# Patient Record
Sex: Female | Born: 1985 | Race: White | Hispanic: No | Marital: Married | Smoking: Never smoker
Health system: Southern US, Community
[De-identification: ages and names within clinical notes are randomized; demographics above are authoritative.]

## PROBLEM LIST (undated history)

## (undated) ENCOUNTER — Inpatient Hospital Stay (HOSPITAL_COMMUNITY): Payer: Self-pay

## (undated) DIAGNOSIS — M779 Enthesopathy, unspecified: Secondary | ICD-10-CM

## (undated) DIAGNOSIS — F419 Anxiety disorder, unspecified: Secondary | ICD-10-CM

## (undated) DIAGNOSIS — F329 Major depressive disorder, single episode, unspecified: Secondary | ICD-10-CM

## (undated) DIAGNOSIS — Z8739 Personal history of other diseases of the musculoskeletal system and connective tissue: Secondary | ICD-10-CM

## (undated) DIAGNOSIS — E079 Disorder of thyroid, unspecified: Secondary | ICD-10-CM

## (undated) DIAGNOSIS — J45909 Unspecified asthma, uncomplicated: Secondary | ICD-10-CM

## (undated) DIAGNOSIS — O139 Gestational [pregnancy-induced] hypertension without significant proteinuria, unspecified trimester: Secondary | ICD-10-CM

## (undated) DIAGNOSIS — E039 Hypothyroidism, unspecified: Secondary | ICD-10-CM

## (undated) DIAGNOSIS — J302 Other seasonal allergic rhinitis: Secondary | ICD-10-CM

## (undated) DIAGNOSIS — R51 Headache: Secondary | ICD-10-CM

## (undated) HISTORY — DX: Unspecified asthma, uncomplicated: J45.909

## (undated) HISTORY — PX: TONSILLECTOMY: SUR1361

## (undated) HISTORY — DX: Personal history of other diseases of the musculoskeletal system and connective tissue: Z87.39

## (undated) HISTORY — DX: Hypothyroidism, unspecified: E03.9

## (undated) HISTORY — DX: Anxiety disorder, unspecified: F41.9

## (undated) HISTORY — DX: Disorder of thyroid, unspecified: E07.9

## (undated) HISTORY — DX: Major depressive disorder, single episode, unspecified: F32.9

## (undated) HISTORY — PX: OTHER SURGICAL HISTORY: SHX169

## (undated) HISTORY — DX: Headache: R51

## (undated) HISTORY — DX: Gestational (pregnancy-induced) hypertension without significant proteinuria, unspecified trimester: O13.9

---

## 2007-02-27 DIAGNOSIS — E039 Hypothyroidism, unspecified: Secondary | ICD-10-CM

## 2007-02-27 DIAGNOSIS — F32A Depression, unspecified: Secondary | ICD-10-CM

## 2007-02-27 HISTORY — DX: Depression, unspecified: F32.A

## 2007-02-27 HISTORY — DX: Hypothyroidism, unspecified: E03.9

## 2008-02-27 DIAGNOSIS — F419 Anxiety disorder, unspecified: Secondary | ICD-10-CM

## 2008-02-27 HISTORY — DX: Anxiety disorder, unspecified: F41.9

## 2010-02-26 DIAGNOSIS — O139 Gestational [pregnancy-induced] hypertension without significant proteinuria, unspecified trimester: Secondary | ICD-10-CM

## 2010-02-26 HISTORY — DX: Gestational (pregnancy-induced) hypertension without significant proteinuria, unspecified trimester: O13.9

## 2010-06-23 ENCOUNTER — Other Ambulatory Visit (HOSPITAL_COMMUNITY): Payer: Self-pay | Admitting: Advanced Practice Midwife

## 2010-06-23 DIAGNOSIS — O2690 Pregnancy related conditions, unspecified, unspecified trimester: Secondary | ICD-10-CM

## 2010-06-27 ENCOUNTER — Other Ambulatory Visit (HOSPITAL_COMMUNITY): Payer: Self-pay | Admitting: Advanced Practice Midwife

## 2010-06-27 ENCOUNTER — Ambulatory Visit (HOSPITAL_COMMUNITY)
Admission: RE | Admit: 2010-06-27 | Discharge: 2010-06-27 | Disposition: A | Discharge status: Home / Self Care | Payer: BC Managed Care – PPO | Source: Ambulatory Visit | Attending: Advanced Practice Midwife | Admitting: Advanced Practice Midwife

## 2010-06-27 DIAGNOSIS — O358XX0 Maternal care for other (suspected) fetal abnormality and damage, not applicable or unspecified: Secondary | ICD-10-CM | POA: Insufficient documentation

## 2010-06-27 DIAGNOSIS — N2889 Other specified disorders of kidney and ureter: Secondary | ICD-10-CM

## 2010-06-27 DIAGNOSIS — Z1389 Encounter for screening for other disorder: Secondary | ICD-10-CM | POA: Insufficient documentation

## 2010-06-27 DIAGNOSIS — O2690 Pregnancy related conditions, unspecified, unspecified trimester: Secondary | ICD-10-CM

## 2010-06-27 DIAGNOSIS — Z363 Encounter for antenatal screening for malformations: Secondary | ICD-10-CM | POA: Insufficient documentation

## 2010-07-18 ENCOUNTER — Ambulatory Visit (HOSPITAL_COMMUNITY): Payer: BC Managed Care – PPO

## 2010-07-19 ENCOUNTER — Other Ambulatory Visit (HOSPITAL_COMMUNITY): Payer: Self-pay | Admitting: Maternal and Fetal Medicine

## 2010-07-19 DIAGNOSIS — O288 Other abnormal findings on antenatal screening of mother: Secondary | ICD-10-CM

## 2010-07-25 ENCOUNTER — Ambulatory Visit (HOSPITAL_COMMUNITY)
Admission: RE | Admit: 2010-07-25 | Discharge: 2010-07-25 | Disposition: A | Discharge status: Home / Self Care | Payer: BC Managed Care – PPO | Source: Ambulatory Visit | Attending: Advanced Practice Midwife | Admitting: Advanced Practice Midwife

## 2010-07-25 ENCOUNTER — Other Ambulatory Visit (HOSPITAL_COMMUNITY): Payer: Self-pay | Admitting: Maternal and Fetal Medicine

## 2010-07-25 DIAGNOSIS — O288 Other abnormal findings on antenatal screening of mother: Secondary | ICD-10-CM

## 2010-07-25 DIAGNOSIS — O358XX0 Maternal care for other (suspected) fetal abnormality and damage, not applicable or unspecified: Secondary | ICD-10-CM | POA: Insufficient documentation

## 2010-07-25 DIAGNOSIS — Z3689 Encounter for other specified antenatal screening: Secondary | ICD-10-CM | POA: Insufficient documentation

## 2010-08-05 ENCOUNTER — Inpatient Hospital Stay (HOSPITAL_COMMUNITY)
Admission: AD | Admit: 2010-08-05 | Discharge: 2010-08-05 | Disposition: A | Discharge status: Home / Self Care | Payer: BC Managed Care – PPO | Source: Ambulatory Visit | Attending: Obstetrics and Gynecology | Admitting: Obstetrics and Gynecology

## 2010-08-05 DIAGNOSIS — O479 False labor, unspecified: Secondary | ICD-10-CM | POA: Insufficient documentation

## 2010-08-20 ENCOUNTER — Inpatient Hospital Stay (HOSPITAL_COMMUNITY): Admission: AD | Admit: 2010-08-20 | Payer: Self-pay | Source: Home / Self Care | Admitting: Obstetrics and Gynecology

## 2010-08-24 ENCOUNTER — Inpatient Hospital Stay (HOSPITAL_COMMUNITY)
Admission: AD | Admit: 2010-08-24 | Discharge: 2010-08-27 | DRG: 373 | Disposition: A | Discharge status: Home / Self Care | Payer: BC Managed Care – PPO | Source: Ambulatory Visit | Attending: Obstetrics and Gynecology | Admitting: Obstetrics and Gynecology

## 2010-08-24 ENCOUNTER — Encounter (HOSPITAL_COMMUNITY): Payer: Self-pay | Admitting: *Deleted

## 2010-08-24 DIAGNOSIS — O358XX0 Maternal care for other (suspected) fetal abnormality and damage, not applicable or unspecified: Principal | ICD-10-CM | POA: Diagnosis present

## 2010-08-24 LAB — CBC
HCT: 35.7 % — ABNORMAL LOW (ref 36.0–46.0)
Hemoglobin: 12.4 g/dL (ref 12.0–15.0)
MCV: 89.5 fL (ref 78.0–100.0)
RDW: 13.4 % (ref 11.5–15.5)
WBC: 11.7 10*3/uL — ABNORMAL HIGH (ref 4.0–10.5)

## 2010-08-26 LAB — CBC
HCT: 33.5 % — ABNORMAL LOW (ref 36.0–46.0)
MCH: 30 pg (ref 26.0–34.0)
MCV: 88.9 fL (ref 78.0–100.0)
RBC: 3.77 MIL/uL — ABNORMAL LOW (ref 3.87–5.11)
WBC: 17.2 10*3/uL — ABNORMAL HIGH (ref 4.0–10.5)

## 2011-10-30 ENCOUNTER — Ambulatory Visit: Payer: Self-pay | Admitting: Obstetrics and Gynecology

## 2011-11-21 ENCOUNTER — Ambulatory Visit (INDEPENDENT_AMBULATORY_CARE_PROVIDER_SITE_OTHER): Payer: BC Managed Care – PPO | Admitting: Obstetrics and Gynecology

## 2011-11-21 ENCOUNTER — Encounter: Payer: Self-pay | Admitting: Obstetrics and Gynecology

## 2011-11-21 DIAGNOSIS — Z331 Pregnant state, incidental: Secondary | ICD-10-CM

## 2011-11-21 LAB — POCT URINALYSIS DIPSTICK
Blood, UA: NEGATIVE
Glucose, UA: NEGATIVE
Spec Grav, UA: 1.01
Urobilinogen, UA: NEGATIVE
pH, UA: 6

## 2011-11-21 NOTE — Progress Notes (Signed)
NOB interview. States had TSH done 10/11/11. Will obtain results and plans F/U with PCP.

## 2011-11-22 LAB — PRENATAL PANEL VII
Basophils Relative: 1 % (ref 0–1)
Eosinophils Absolute: 0.2 10*3/uL (ref 0.0–0.7)
Eosinophils Relative: 3 % (ref 0–5)
HCT: 37.7 % (ref 36.0–46.0)
Hemoglobin: 12.6 g/dL (ref 12.0–15.0)
Hepatitis B Surface Ag: NEGATIVE
Lymphs Abs: 1.9 10*3/uL (ref 0.7–4.0)
MCH: 29.6 pg (ref 26.0–34.0)
MCHC: 33.4 g/dL (ref 30.0–36.0)
MCV: 88.7 fL (ref 78.0–100.0)
Monocytes Absolute: 0.4 10*3/uL (ref 0.1–1.0)
Monocytes Relative: 7 % (ref 3–12)
Neutrophils Relative %: 58 % (ref 43–77)
RBC: 4.25 MIL/uL (ref 3.87–5.11)
Rh Type: POSITIVE
Rubella: 26.5 IU/mL — ABNORMAL HIGH

## 2011-11-22 LAB — TOXOPLASMA ANTIBODIES- IGG AND  IGM: Toxoplasma Antibody- IgM: <3 IU/mL (ref <8.0)

## 2011-11-23 LAB — CULTURE, OB URINE
Colony Count: NO GROWTH
Organism ID, Bacteria: NO GROWTH

## 2011-11-27 ENCOUNTER — Inpatient Hospital Stay (HOSPITAL_COMMUNITY)
Admission: AD | Admit: 2011-11-27 | Discharge: 2011-11-28 | Disposition: A | Discharge status: Home / Self Care | Payer: BC Managed Care – PPO | Source: Ambulatory Visit | Attending: Obstetrics and Gynecology | Admitting: Obstetrics and Gynecology

## 2011-11-27 ENCOUNTER — Encounter (HOSPITAL_COMMUNITY): Payer: Self-pay | Admitting: *Deleted

## 2011-11-27 ENCOUNTER — Telehealth: Payer: Self-pay | Admitting: Obstetrics and Gynecology

## 2011-11-27 DIAGNOSIS — O200 Threatened abortion: Secondary | ICD-10-CM | POA: Insufficient documentation

## 2011-11-27 DIAGNOSIS — O021 Missed abortion: Secondary | ICD-10-CM

## 2011-11-27 NOTE — Telephone Encounter (Signed)
Patient at [redacted]w[redacted]d and has some cramping this pm. Within 1 hrs had mucoid secretion with a strike of bleed through the mucus. The patient has no further episodes.  Advised to wear a pad for 2 hrs and re evaluate. If further episodes to call back. If no further events to have NOB workup on 12/20/11  Earl Gala, CNM.

## 2011-11-27 NOTE — MAU Note (Signed)
PT SAYS  HAD CRAMPING YESTERDAY.  THEN SAME TODAY-  WENT TO B-ROOM AT 5 PM- HAD BROWN D/C.  THEN  AT  8PM-  HAD MUCUSY  BLOOD CLOT-  CALLED CNM-    WAITED 30 MIN.  CALLED  AGAIN- THEN MORE BLEEDING.Carol Ayala

## 2011-11-28 ENCOUNTER — Encounter (HOSPITAL_COMMUNITY): Payer: Self-pay

## 2011-11-28 ENCOUNTER — Encounter: Payer: Self-pay | Admitting: Obstetrics and Gynecology

## 2011-11-28 ENCOUNTER — Inpatient Hospital Stay (HOSPITAL_COMMUNITY): Payer: BC Managed Care – PPO

## 2011-11-28 LAB — CBC
HCT: 35 % — ABNORMAL LOW (ref 36.0–46.0)
Hemoglobin: 11.8 g/dL — ABNORMAL LOW (ref 12.0–15.0)
MCH: 29.8 pg (ref 26.0–34.0)
RBC: 3.96 MIL/uL (ref 3.87–5.11)

## 2011-11-28 LAB — HCG, QUANTITATIVE, PREGNANCY: hCG, Beta Chain, Quant, S: 2884 m[IU]/mL — ABNORMAL HIGH (ref <5)

## 2011-11-28 LAB — WET PREP, GENITAL
Trich, Wet Prep: NONE SEEN
Yeast Wet Prep HPF POC: NONE SEEN

## 2011-11-28 LAB — GC/CHLAMYDIA PROBE AMP, GENITAL: GC Probe Amp, Genital: NEGATIVE

## 2011-11-28 NOTE — MAU Provider Note (Signed)
History     CSN: [id number]  Arrival date and time: 11/27/11 2332   None     Chief Complaint  Patient presents with  . Vaginal Bleeding  . Abdominal Cramping   HPI Comments: Pt is a G2P1 at approximately [redacted]w[redacted]d based on sure LMP of 10/04/11. Arrives tonight after calling earlier in the day to report vaginal bleeding. She denies any other complaints.  She states she had some brown d/c noted yesterday after IC, then some light spotting and more heavier bleeding today and some clots. She states she's having some mild cramping.   Vaginal Bleeding Associated symptoms include abdominal pain.  Abdominal Cramping     Past Medical History  Diagnosis Date  . Thyroid disease     on synthroid   . Hypothyroidism 2009    [facility]  . Anxiety 2010    CELEXA X 1 YR  . Depression 2009    CELEXA X 1 YR; HAD COUNSELING  . Pregnancy induced hypertension 2012  . Asthma     CHILDHOOD  . Headache AS TEEN    TEEN AND WITH DEPRESSSION  . History of TMJ disorder     Past Surgical History  Procedure Date  . Tonsillectomy CHILDHOOD  . Wisdom teeth AGE 22    Family History  Problem Relation Age of Onset  . Emphysema Maternal Grandfather   . Thyroid disease Paternal Grandmother   . Cancer Paternal Grandmother   . Arthritis Paternal Grandmother   . Other Paternal Grandmother     SLOW TO AWAKEN  . Thyroid disease Maternal Aunt   . Anemia Mother   . Depression Mother     History  Substance Use Topics  . Smoking status: Never Smoker   . Smokeless tobacco: Never Used  . Alcohol Use: 0.5 oz/week    1 drink(s) per week     NONE SINCE PREGNANT    Allergies:  Allergies  Allergen Reactions  . Other     STRAWBERRIES; CHERRIES - HIVES  . Penicillins Hives    Prescriptions prior to admission  Medication Sig Dispense Refill  . cetirizine (ZYRTEC) 10 MG tablet Take 10 mg by mouth daily.      . fish oil-omega-3 fatty acids 1000 MG capsule Take 2 g by mouth daily.       Marland Kitchen levothyroxine (SYNTHROID, LEVOTHROID) 50 MCG tablet Take 50 mcg by mouth daily.      Marland Kitchen PRENATAL VITAMINS PO Take 1 tablet by mouth. otc        Review of Systems  Gastrointestinal: Positive for abdominal pain.  Genitourinary: Positive for vaginal bleeding.       Vaginal bleeding  All other systems reviewed and are negative.   Physical Exam   Blood pressure 111/67, pulse 81, temperature 98.1 F (36.7 C), temperature source Oral, resp. rate 18, height 5\' 2"  (1.575 m), weight 136 lb (61.689 kg), last menstrual period 10/04/2011, unknown if currently breastfeeding.  Physical Exam  Nursing note and vitals reviewed. Constitutional: She is oriented to person, place, and time. She appears well-developed and well-nourished.  HENT:  Head: Normocephalic.  Eyes: Pupils are equal, round, and reactive to light.  Neck: Normal range of motion.  Cardiovascular: Normal rate, regular rhythm and normal heart sounds.   Respiratory: Effort normal and breath sounds normal.  GI: Soft. Bowel sounds are normal. She exhibits no distension. There is no tenderness.  Genitourinary: Vaginal discharge found.       Mod amt dark red bleeding in  vault  cx firm and closed   Musculoskeletal: Normal range of motion.  Neurological: She is alert and oriented to person, place, and time.  Skin: Skin is warm and dry.  Psychiatric: She has a normal mood and affect. Her behavior is normal.    MAU Course  Procedures    Assessment and Plan  Dating based on LMP =[redacted]w[redacted]d  [place] c/w SAB in progress  No GA R cystic lesion in R ovary, likely corpus luteum cyst  No ectopic pregnancy identified  Will draw Bquant and CBC for baseline    Lizvet Chunn M 11/28/2011, 1:15 AM   CBC stable BHCG = 2884  Pt given bleeding and ectopic precautions Pt will return to MAU on Saturday for a repeat quant. Encouraged to call w any questions.   S.Dajsha Massaro, CNM @0230

## 2011-11-28 NOTE — Progress Notes (Signed)
Per SL, NOB w/u changed to GYN due to probable SAB.

## 2011-11-29 ENCOUNTER — Telehealth: Payer: Self-pay | Admitting: Obstetrics and Gynecology

## 2011-11-29 NOTE — Telephone Encounter (Signed)
Grief card mailed for missed ab on 11/28/11. -ap

## 2011-12-01 ENCOUNTER — Inpatient Hospital Stay (HOSPITAL_COMMUNITY)
Admission: AD | Admit: 2011-12-01 | Discharge: 2011-12-01 | Disposition: A | Discharge status: Home / Self Care | Payer: BC Managed Care – PPO | Source: Ambulatory Visit | Attending: Obstetrics and Gynecology | Admitting: Obstetrics and Gynecology

## 2011-12-01 DIAGNOSIS — O039 Complete or unspecified spontaneous abortion without complication: Secondary | ICD-10-CM | POA: Insufficient documentation

## 2011-12-01 DIAGNOSIS — R109 Unspecified abdominal pain: Secondary | ICD-10-CM | POA: Insufficient documentation

## 2011-12-01 NOTE — MAU Note (Signed)
Pt here for f/u BHCG after miscarriage. Pt reports having some light to medium vaginal bleeding  That is much less than when she was here on Thursday. Mild cramping as well.

## 2011-12-01 NOTE — ED Notes (Signed)
D.Davies, CNm notified pt was here for BHCG. Pt may go home and she will call her with results

## 2011-12-01 NOTE — Progress Notes (Signed)
Notified D. Davies,CNM  of  BHCG results. She will call pt at home with results

## 2011-12-07 ENCOUNTER — Other Ambulatory Visit: Payer: Self-pay | Admitting: Obstetrics and Gynecology

## 2011-12-07 ENCOUNTER — Telehealth: Payer: Self-pay | Admitting: Obstetrics and Gynecology

## 2011-12-07 NOTE — Telephone Encounter (Signed)
Message copied by Delon Sacramento on Fri Dec 07, 2011 10:01 AM ------      Message from: Malissa Hippo.      Created: Fri Dec 07, 2011  1:13 AM      Regarding: f/u BHCG       Pt came to MAU for quant on 10-5      She's had a 9wk miscarriage       She needs a repeat quant weekly until zero      It looks like she's scheduled for a return gyn w me on 10-24      But it also says NOB.       She doesn't need an appt in the office, unless she wants to see me.             Also her pregnancy encounter needs closed (I'm not sure how to do that)             Please tell her I will call her on Monday                  Thanks!      SL

## 2011-12-07 NOTE — Telephone Encounter (Signed)
[name] to pt regarding msg below, pt scheduled for standing orders to have qhcg's to be done @ [facility] every Saturday which is more convenient for pt.  Pt's appt for NOB work up cancelled, pt informed of last qhcg results and informed that SL will call her on Monday, pt voices agreement to all.

## 2011-12-08 ENCOUNTER — Inpatient Hospital Stay (HOSPITAL_COMMUNITY)
Admission: AD | Admit: 2011-12-08 | Discharge: 2011-12-08 | Disposition: A | Discharge status: Home / Self Care | Payer: BC Managed Care – PPO | Source: Ambulatory Visit | Attending: Obstetrics and Gynecology | Admitting: Obstetrics and Gynecology

## 2011-12-08 DIAGNOSIS — O039 Complete or unspecified spontaneous abortion without complication: Secondary | ICD-10-CM | POA: Insufficient documentation

## 2011-12-08 NOTE — MAU Provider Note (Signed)
Pt came in repeat quant, secondary to SAB at 9wks   She is doing well, denies any pain, and is having some brown discharge, her and her husband are coping and grieving appropriately.  Exam deferred Results for orders placed during the hospital encounter of 12/08/11 (from the past 24 hour(s))  HCG, QUANTITATIVE, PREGNANCY     Status: Abnormal   Collection Time   12/08/11 10:30 AM      Component Value Range   hCG, Beta Chain, Quant, S 28 (*) <5 mIU/mL   10-2 quant was 2884 10-5 =303  A: SAB Quant dropping appropriately  P: called pt w results, left message as she requested  F/u 1 week for repeat quant Call office for concerns Discussed recommendations for trying to conceive and normal for cycles to be irregular

## 2011-12-08 NOTE — MAU Note (Signed)
Patient to MAU for repeat BHCG. Patient denies any pain but does have slight brown spotting.

## 2011-12-14 ENCOUNTER — Encounter: Payer: Self-pay | Admitting: Obstetrics and Gynecology

## 2011-12-20 ENCOUNTER — Encounter: Payer: BC Managed Care – PPO | Admitting: Obstetrics and Gynecology

## 2011-12-22 ENCOUNTER — Inpatient Hospital Stay (HOSPITAL_COMMUNITY)
Admission: AD | Admit: 2011-12-22 | Discharge: 2011-12-22 | Disposition: A | Discharge status: Home / Self Care | Payer: BC Managed Care – PPO | Source: Ambulatory Visit | Attending: Obstetrics and Gynecology | Admitting: Obstetrics and Gynecology

## 2011-12-22 ENCOUNTER — Telehealth: Payer: Self-pay | Admitting: Obstetrics and Gynecology

## 2011-12-22 DIAGNOSIS — O039 Complete or unspecified spontaneous abortion without complication: Secondary | ICD-10-CM | POA: Insufficient documentation

## 2011-12-22 NOTE — Telephone Encounter (Signed)
TC to patient to review [facility] results from today--being followed s/p SAB in early October. QHCG 10/5 = 303, 10/12 = 28 Today = 1. No bleeding, cramping, or other pain. Blood type B+. Will call with any questions or concerns. Support offered for loss.

## 2011-12-22 NOTE — ED Notes (Signed)
Pt here for repeat BHCG. Denies any pain or bleeding at this time. WIll go home and be called with results by provider

## 2012-03-18 ENCOUNTER — Telehealth: Payer: Self-pay | Admitting: Obstetrics and Gynecology

## 2012-03-18 NOTE — Telephone Encounter (Signed)
Please pull chart for abstraction

## 2012-04-21 ENCOUNTER — Encounter: Payer: Self-pay | Admitting: Obstetrics and Gynecology

## 2012-04-29 ENCOUNTER — Encounter (HOSPITAL_COMMUNITY): Payer: Self-pay | Admitting: *Deleted

## 2012-04-29 ENCOUNTER — Inpatient Hospital Stay (HOSPITAL_COMMUNITY)
Admission: AD | Admit: 2012-04-29 | Discharge: 2012-04-29 | Disposition: A | Discharge status: Home / Self Care | Payer: BC Managed Care – PPO | Source: Ambulatory Visit | Attending: Obstetrics and Gynecology | Admitting: Obstetrics and Gynecology

## 2012-04-29 ENCOUNTER — Inpatient Hospital Stay (HOSPITAL_COMMUNITY): Payer: BC Managed Care – PPO

## 2012-04-29 DIAGNOSIS — R109 Unspecified abdominal pain: Secondary | ICD-10-CM | POA: Insufficient documentation

## 2012-04-29 DIAGNOSIS — O26859 Spotting complicating pregnancy, unspecified trimester: Secondary | ICD-10-CM | POA: Insufficient documentation

## 2012-04-29 HISTORY — DX: Enthesopathy, unspecified: M77.9

## 2012-04-29 HISTORY — DX: Other seasonal allergic rhinitis: J30.2

## 2012-04-29 NOTE — MAU Provider Note (Signed)
  History  27yo, G3P1101 at [redacted]w[redacted]d presents unannounced today with cramping starting yesterday afternoon with spotting starting last night and continuing through the night.  Last IC Sunday.  Pt reports cramping is less today.  H/o of 1st T SAB in 2013; resolved spontaneously. Blood type B+.   CSN: [id number]  Arrival date and time: 04/29/12 0900   First Demetria Iwai Initiated Contact with Patient 04/29/12 0932      Chief Complaint  Patient presents with  . Abdominal Pain  . Vaginal Bleeding   HPI    Past Medical History  Diagnosis Date  . Thyroid disease     on synthroid   . Hypothyroidism 2009    [facility]  . Anxiety 2010    CELEXA X 1 YR  . Depression 2009    CELEXA X 1 YR; HAD COUNSELING  . Pregnancy induced hypertension 2012  . Asthma     CHILDHOOD  . Headache AS TEEN    TEEN AND WITH DEPRESSSION  . History of TMJ disorder   . Seasonal allergies   . Tendonitis     Past Surgical History  Procedure Laterality Date  . Tonsillectomy  CHILDHOOD  . Wisdom teeth  AGE 23    Family History  Problem Relation Age of Onset  . Emphysema Maternal Grandfather   . Thyroid disease Paternal Grandmother   . Cancer Paternal Grandmother   . Arthritis Paternal Grandmother   . Other Paternal Grandmother     SLOW TO AWAKEN  . Thyroid disease Maternal Aunt   . Anemia Mother   . Depression Mother     History  Substance Use Topics  . Smoking status: Never Smoker   . Smokeless tobacco: Never Used  . Alcohol Use: 0.5 oz/week    1 drink(s) per week     Comment: NONE SINCE PREGNANT    Allergies:  Allergies  Allergen Reactions  . Other     STRAWBERRIES; CHERRIES - HIVES  . Penicillins Hives    Prescriptions prior to admission  Medication Sig Dispense Refill  . fish oil-omega-3 fatty acids 1000 MG capsule Take 2 g by mouth daily.      Marland Kitchen levothyroxine (SYNTHROID, LEVOTHROID) 50 MCG tablet Take 50 mcg by mouth daily.      . Prenatal Vit-Fe Fumarate-FA (PRENATAL  MULTIVITAMIN) TABS Take 1 tablet by mouth daily at 12 noon.        ROS - see HPI Physical Exam   Blood pressure 111/67, pulse 88, temperature 98.1 F (36.7 C), temperature source Oral, resp. rate 18, height 5\' 4"  (1.626 m), weight 139 lb 12.8 oz (63.413 kg), last menstrual period 02/20/2012, unknown if currently breastfeeding.  Physical Exam  Chest: Clear Heart: RRR without murmur Abdomen: soft, non tender Pelvic: sm amount for brownish-red discharge in vault, no active bleeding; cx appears closed Extremities: WNL  MAU Course  Procedures   Assessment and Plan  Cultures U/S Seward Grater 04/29/2012, 9:48 AM    Addendum   Returned from U/S - [redacted]w[redacted]d fetal pole with no cardiac activity; IUGS, irregular with sm [facility] Quant pending (sent to outside lab) - will follow up Findings suspicious for failed pregnancy but will plan f/u quant on Thursday 3/6 with possible U/S f/u depending on quant results Rv'd with pt and husband, support offered, precautions rv'd  Nigel Bridgeman 04/29/2012, 1:30PM

## 2012-04-29 NOTE — MAU Note (Signed)
Verified name and DOB;  Pt confirmed name is spelled correctly on armband.

## 2012-04-29 NOTE — MAU Note (Signed)
Started cramping last night, cramped throughout night.  Started spotting during the night.  Bloody mucous and small clots noted when wiped. Small amt of bleeding after intercourse on Sunday.  Hx of SAB last OCT.

## 2012-04-30 ENCOUNTER — Telehealth: Payer: Self-pay | Admitting: Certified Nurse Midwife

## 2012-04-30 NOTE — Telephone Encounter (Signed)
TC from pt regarding bleeding and cramping.  Started earlier today but got worse in the last hour.  Seen yesterday in MAU - dx'd with possible miscarriage by U/S.  [redacted]w[redacted]d fetal pole, no cardiac activity, was supposed to be 9 weeks by LMP.  Offered her evaluation; pt declines at this time.  Advised to take 600mg  Motrin Q6hrs, encouraged hydration and to call with concerns or if sx worsen. Encouraged her to keep her sch appt tomorrow.

## 2012-05-01 ENCOUNTER — Encounter: Payer: Self-pay | Admitting: Certified Nurse Midwife

## 2012-05-01 ENCOUNTER — Ambulatory Visit: Payer: BC Managed Care – PPO | Admitting: Certified Nurse Midwife

## 2012-05-01 VITALS — BP 100/66 | Resp 16 | Wt 144.0 lb

## 2012-05-01 DIAGNOSIS — O039 Complete or unspecified spontaneous abortion without complication: Secondary | ICD-10-CM

## 2012-05-01 DIAGNOSIS — Z331 Pregnant state, incidental: Secondary | ICD-10-CM

## 2012-05-01 LAB — HCG, QUANTITATIVE, PREGNANCY: hCG, Beta Chain, Quant, S: 6453 m[IU]/mL

## 2012-05-01 LAB — CBC
HCT: 33.7 % — ABNORMAL LOW (ref 36.0–46.0)
Hemoglobin: 11.6 g/dL — ABNORMAL LOW (ref 12.0–15.0)
RBC: 3.85 MIL/uL — ABNORMAL LOW (ref 3.87–5.11)
WBC: 6.5 10*3/uL (ref 4.0–10.5)

## 2012-05-01 NOTE — Progress Notes (Signed)
Patient ID: Carol Ayala, female   DOB: 1985/05/28, 28 y.o.   MRN: [id number]  Subjective: Pt was seen in MAU on 3/4 for bleeding and cramping at 9 weeks.  No cardiac activity seen on U/S.  Pt was encouraged to keep an already scheduled appt on 3/6 for repeat [facility] labs.    Called on-call midwife the following day with increased bleeding with clots and increased cramping.  Encouraged to take Motrin, increase hydration and call with concerns.  Today seen in the office to repeat [facility] and CBC.  Pt reports decreased bleeding and less cramping.      Objective: Pelvic Exam: Vaginal: normal without tenderness, induration or masses and presence of blood Cervix: normal appearance, presence of blood from cervical os and Cx os closed Adnexa: normal bimanual exam Uterus: normal single, nontender  Assessment: SAB H/o previous 1st trimester SAB in 2013   Plan: QHCG and CBC Support given Discussed reasons to call office; temp, increase bleeding or pain, questions or concerns Pt instructions given  Rowan Blase, [name]

## 2012-05-01 NOTE — Patient Instructions (Signed)
Miscarriage A miscarriage is the sudden loss of an unborn baby (fetus) before the 20th week of pregnancy. Most miscarriages happen in the first 3 months of pregnancy. Sometimes, it happens before a woman even knows she is pregnant. A miscarriage is also called a "spontaneous miscarriage" or "early pregnancy loss." Having a miscarriage can be an emotional experience. Talk with your caregiver about any questions you may have about miscarrying, the grieving process, and your future pregnancy plans. CAUSES   Problems with the fetal chromosomes that make it impossible for the baby to develop normally. Problems with the baby's genes or chromosomes are most often the result of errors that occur, by chance, as the embryo divides and grows. The problems are not inherited from the parents.  Infection of the cervix or uterus.   Hormone problems.   Problems with the cervix, such as having an incompetent cervix. This is when the tissue in the cervix is not strong enough to hold the pregnancy.   Problems with the uterus, such as an abnormally shaped uterus, uterine fibroids, or congenital abnormalities.   Certain medical conditions.   Smoking, drinking alcohol, or taking illegal drugs.   Trauma.  Often, the cause of a miscarriage is unknown.  SYMPTOMS   Vaginal bleeding or spotting, with or without cramps or pain.  Pain or cramping in the abdomen or lower back.  Passing fluid, tissue, or blood clots from the vagina. DIAGNOSIS  Your caregiver will perform a physical exam. You may also have an ultrasound to confirm the miscarriage. Blood or urine tests may also be ordered. TREATMENT   Sometimes, treatment is not necessary if you naturally pass all the fetal tissue that was in the uterus. If some of the fetus or placenta remains in the body (incomplete miscarriage), tissue left behind may become infected and must be removed. Usually, a dilation and curettage (D and C) procedure is performed.  During a D and C procedure, the cervix is widened (dilated) and any remaining fetal or placental tissue is gently removed from the uterus.  Antibiotic medicines are prescribed if there is an infection. Other medicines may be given to reduce the size of the uterus (contract) if there is a lot of bleeding.  If you have Rh negative blood and your baby was Rh positive, you will need a Rh immunoglobulin shot. This shot will protect any future baby from having Rh blood problems in future pregnancies. HOME CARE INSTRUCTIONS   Your caregiver may order bed rest or may allow you to continue light activity. Resume activity as directed by your caregiver.  Have someone help with home and family responsibilities during this time.   Keep track of the number of sanitary pads you use each day and how soaked (saturated) they are. Write down this information.   Do not use tampons. Do not douche or have sexual intercourse until approved by your caregiver.   Only take over-the-counter or prescription medicines for pain or discomfort as directed by your caregiver.   Do not take aspirin. Aspirin can cause bleeding.   Keep all follow-up appointments with your caregiver.   If you or your partner have problems with grieving, talk to your caregiver or seek counseling to help cope with the pregnancy loss. Allow enough time to grieve before trying to get pregnant again.  SEEK IMMEDIATE MEDICAL CARE IF:   You have severe cramps or pain in your back or abdomen.  You have a fever.  You pass large blood clots (walnut-sized   or larger) ortissue from your vagina. Save any tissue for your caregiver to inspect.   Your bleeding increases.   You have a thick, bad-smelling vaginal discharge.  You become lightheaded, weak, or you faint.   You have chills.  MAKE SURE YOU:  Understand these instructions.  Will watch your condition.  Will get help right away if you are not doing well or get  worse. Document Released: 08/08/2000 Document Revised: 08/14/2011 Document Reviewed: 04/03/2011 [employer] Patient Information 2013 [employer].  

## 2012-05-11 ENCOUNTER — Other Ambulatory Visit: Payer: Self-pay | Admitting: Obstetrics and Gynecology

## 2012-05-11 LAB — HCG, QUANTITATIVE, PREGNANCY: hCG, Beta Chain, Quant, S: 162.5 m[IU]/mL

## 2012-05-12 ENCOUNTER — Telehealth: Payer: Self-pay | Admitting: Obstetrics and Gynecology

## 2012-05-13 ENCOUNTER — Other Ambulatory Visit: Payer: Self-pay | Admitting: Obstetrics and Gynecology

## 2012-05-13 ENCOUNTER — Telehealth: Payer: Self-pay | Admitting: Obstetrics and Gynecology

## 2012-05-13 DIAGNOSIS — O039 Complete or unspecified spontaneous abortion without complication: Secondary | ICD-10-CM

## 2012-05-13 NOTE — Telephone Encounter (Signed)
Tc to pt regarding quants.  Pt informed quant is now down to 162.5, last week was @ 6453.  Pt will get repeat quant drawn @ the hospital until <2.  JO, CNM made aware.  Order entered in system.

## 2012-05-13 NOTE — Telephone Encounter (Signed)
Lm on vm tcb rgd msg 

## 2012-05-13 NOTE — Telephone Encounter (Signed)
Message copied by Delon Sacramento on Tue May 13, 2012  4:43 PM ------      Message from: Haroldine Laws      Created: Mon May 12, 2012  3:12 PM       Can you please call the pt?  She had decided to get her weekly Quants with another provider and I just want to make sure everything is good and that her Quants went down.  It would be great to have her chart forwarded to [place].            Thanks,      Candise Bowens ------

## 2012-05-18 ENCOUNTER — Other Ambulatory Visit: Payer: Self-pay | Admitting: Certified Nurse Midwife

## 2012-05-18 LAB — HCG, QUANTITATIVE, PREGNANCY: hCG, Beta Chain, Quant, S: 35.16 m[IU]/mL

## 2012-09-22 LAB — OB RESULTS CONSOLE GC/CHLAMYDIA
Chlamydia: NEGATIVE
GC PROBE AMP, GENITAL: NEGATIVE

## 2012-09-22 LAB — OB RESULTS CONSOLE RPR: RPR: NONREACTIVE

## 2012-09-22 LAB — OB RESULTS CONSOLE HIV ANTIBODY (ROUTINE TESTING): HIV: NONREACTIVE

## 2013-02-07 IMAGING — US US OB DETAIL+14 WK
1 series · 14 of 28 positions shown · non-contrast
Comparison: none

[Series 1: us ob detail+14 wk · 0.29mm/px · 14 of 77 slices shown]
[im 3/77]
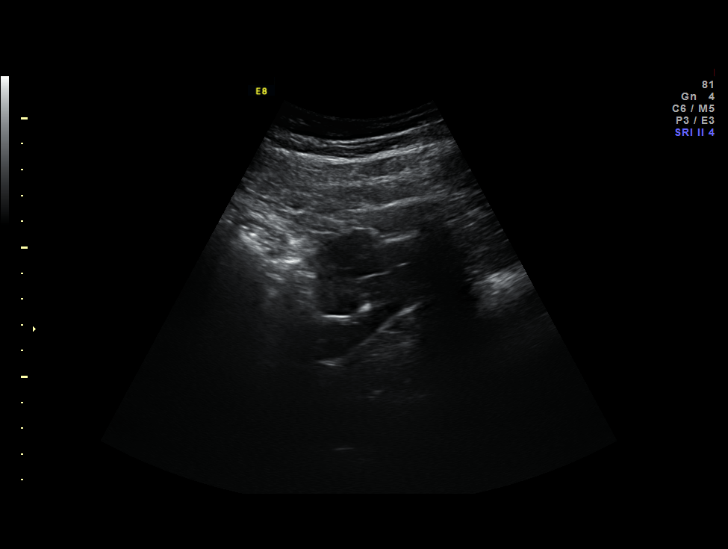
[im 9/77]
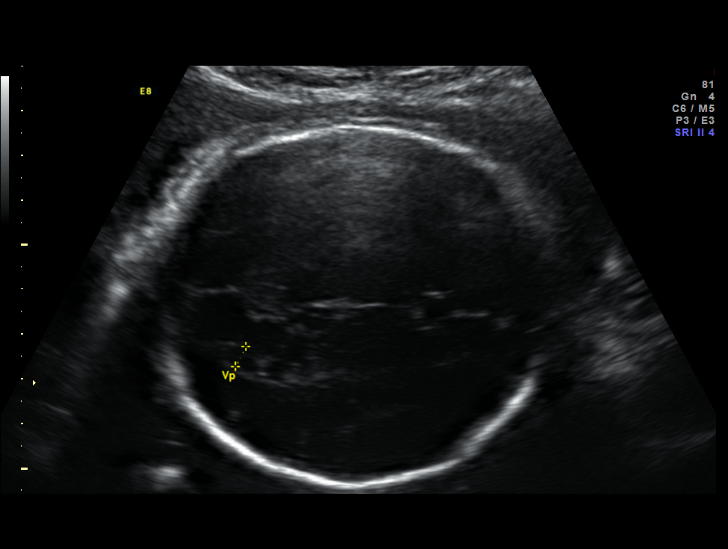
[im 15/77]
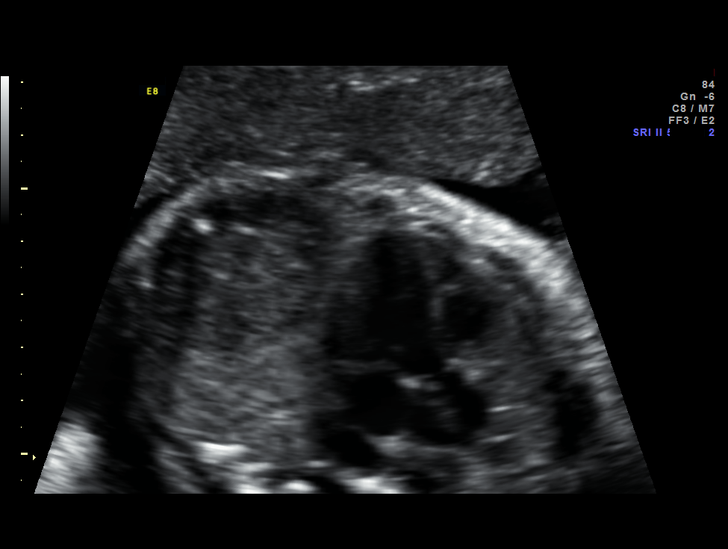
[im 20/77]
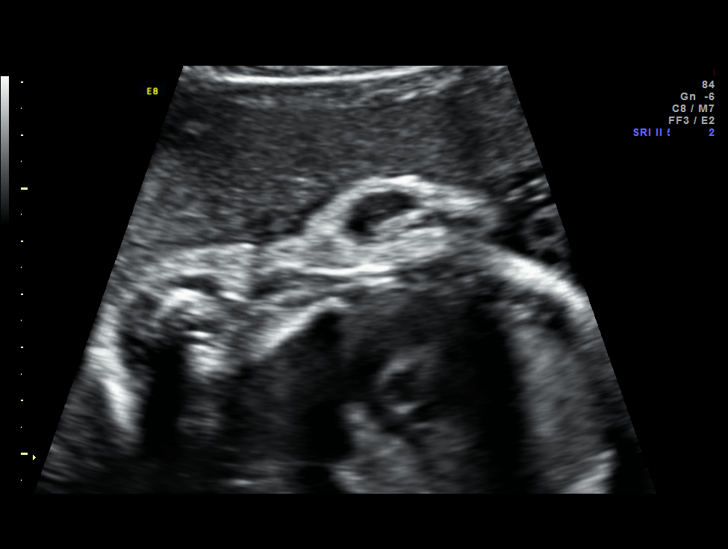
[im 26/77]
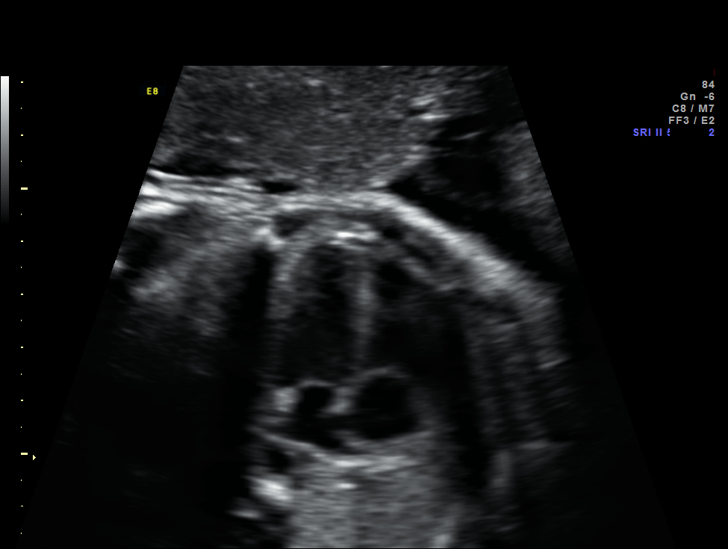
[im 31/77]
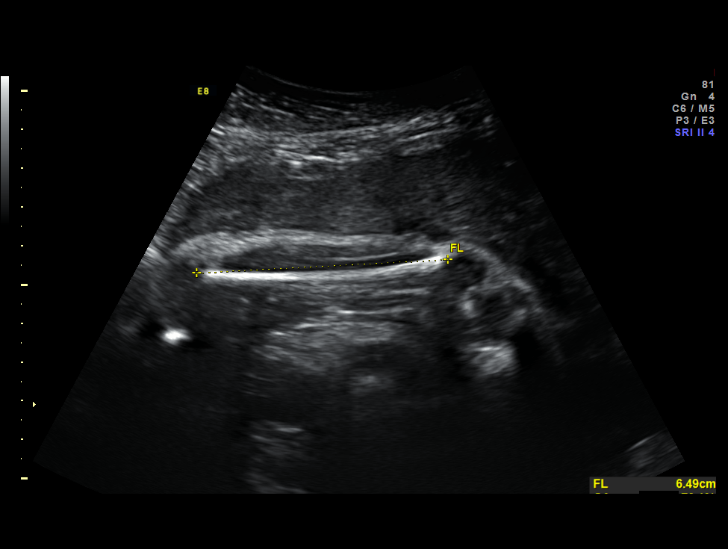
[im 37/77]
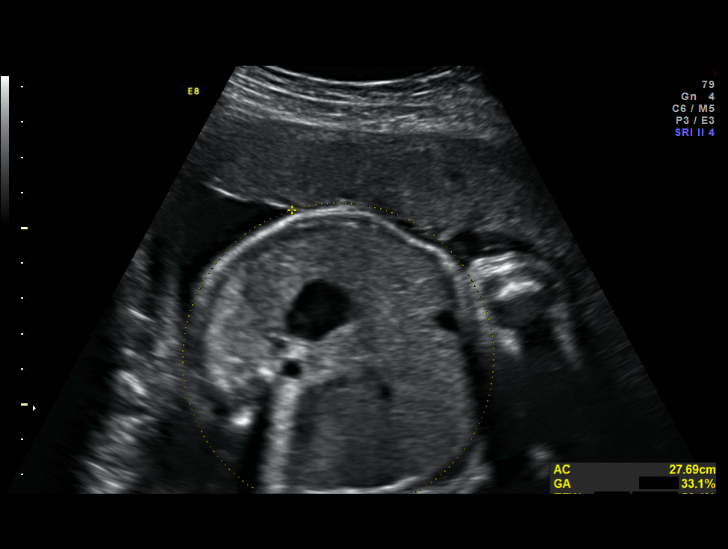
[im 43/77]
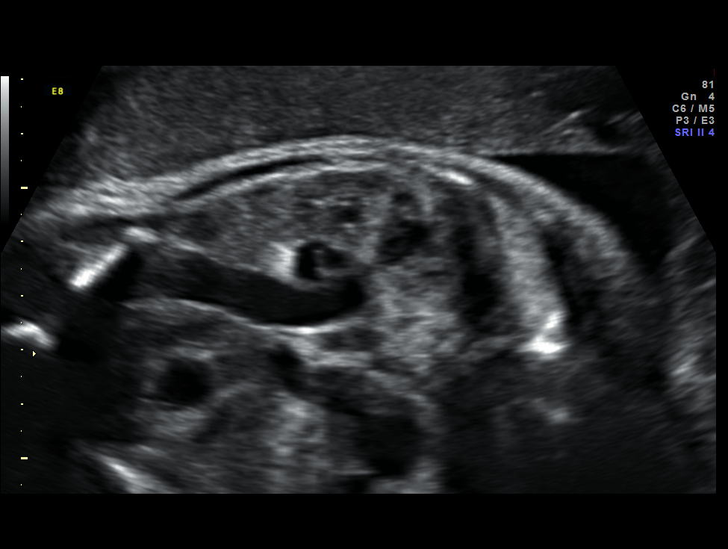
[im 48/77]
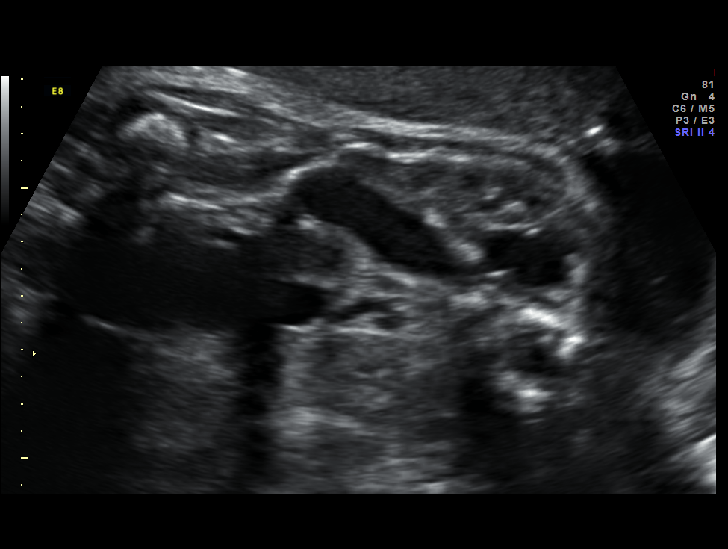
[im 54/77]
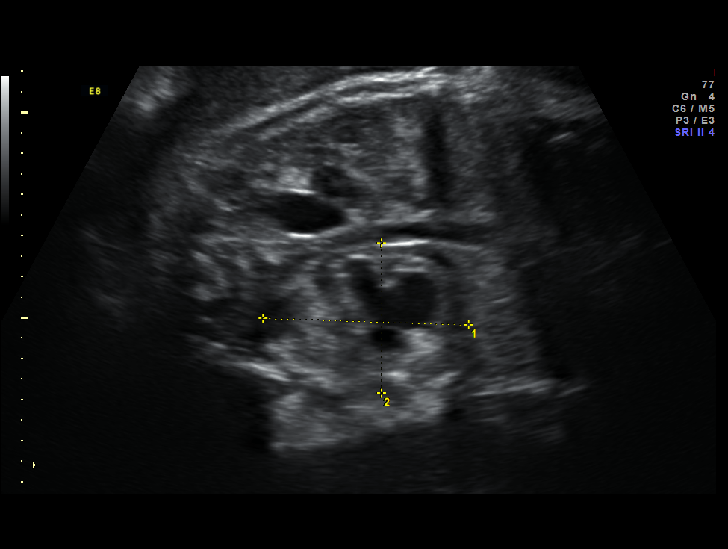
[im 60/77]
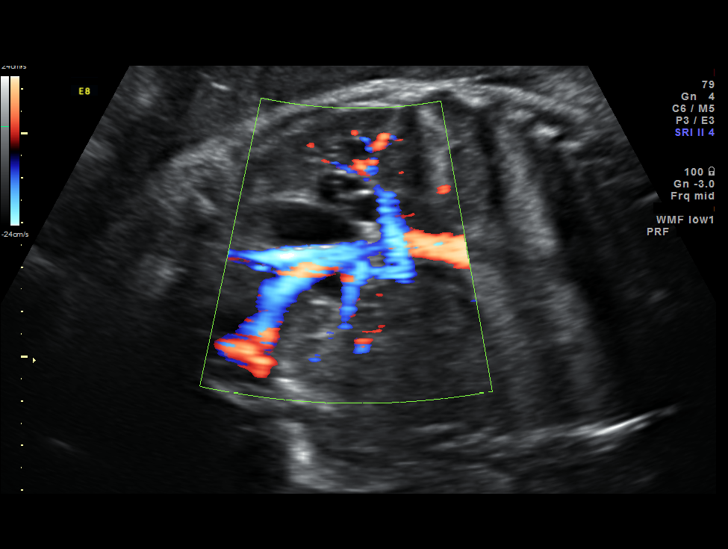
[im 65/77]
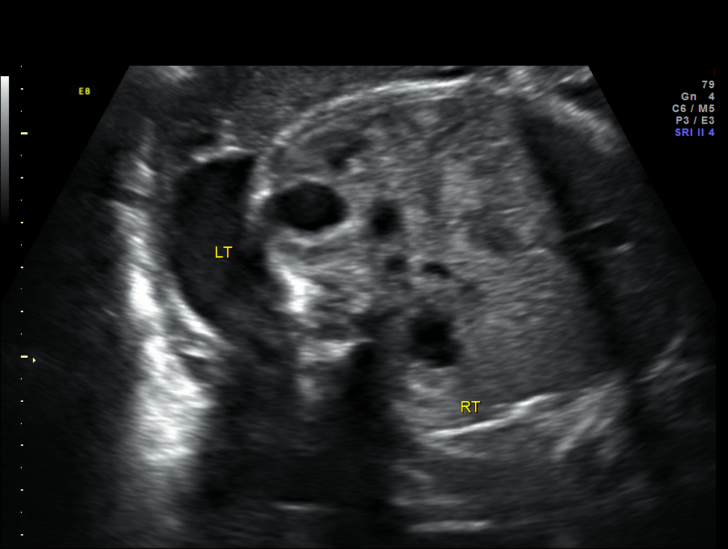
[im 71/77]
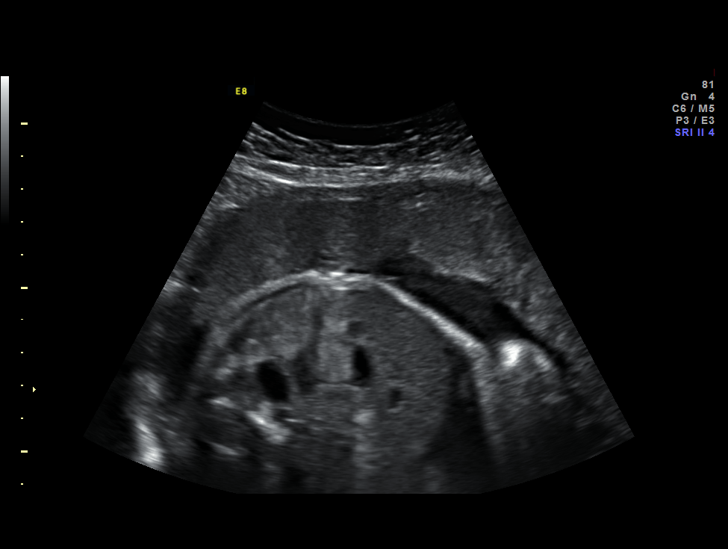
[im 77/77]
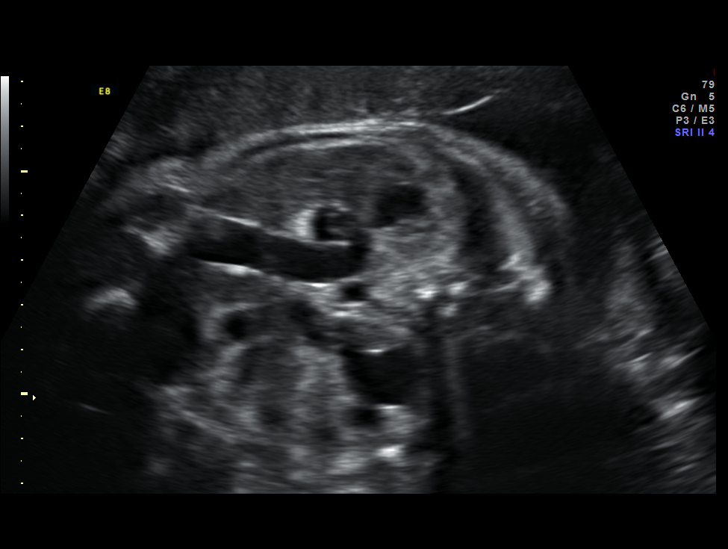

[14 of 28 positions shown; findings below may reference images not displayed]

Canned report from images found in remote index.

Refer to host system for actual result text.

## 2013-02-26 NOTE — L&D Delivery Note (Signed)
Operative Delivery Note At 2:01 PM a viable female "Carol Ayala" was delivered via Vaginal, Spontaneous Delivery.  Presentation: vertex; Position: Right,, Occiput,, Anterior; Station: +5.  NICU, Dr. Stefano GaulStringer, and Faculty called  Delivery of the head: 03/27/2013  2:01 PM First maneuver: 03/27/2013  2:00 PM, Suprapubic Pressure, McRoberts Second maneuver: 03/27/2013  2:00 PM, Woods screw (fetal shoulder rotation)   Verbal consent: obtained from patient.  APGAR: 2/8 ; weight: 9lbs 8oz .   Placenta status: Intact, Spontaneous.   Cord: 3 vessels with the following complications: None.  Cord pH: Arterial: 7.219, Venous: 7.253  Anesthesia: None  Episiotomy: None  Lacerations: None Suture Repair: N/A Est. Blood Loss (mL): 400  Mom to postpartum.  Baby to Couplet care / Skin to Skin.  IM Pitocin given for heavy bleeding--Saline lock displaced during delivery Routine PP orders Breastfeeding Inpatient Circumsion  Carol Ayala 03/27/2013, 2:21 PM

## 2013-03-06 LAB — OB RESULTS CONSOLE GBS: GBS: NEGATIVE

## 2013-03-17 LAB — OB RESULTS CONSOLE GBS: GBS: NEGATIVE

## 2013-03-27 ENCOUNTER — Encounter (HOSPITAL_COMMUNITY): Payer: Self-pay | Admitting: *Deleted

## 2013-03-27 ENCOUNTER — Inpatient Hospital Stay (HOSPITAL_COMMUNITY)
Admission: AD | Admit: 2013-03-27 | Discharge: 2013-03-28 | DRG: 775 | Disposition: A | Discharge status: Home / Self Care | Payer: BC Managed Care – PPO | Source: Ambulatory Visit | Attending: Obstetrics and Gynecology | Admitting: Obstetrics and Gynecology

## 2013-03-27 DIAGNOSIS — E039 Hypothyroidism, unspecified: Secondary | ICD-10-CM | POA: Diagnosis present

## 2013-03-27 DIAGNOSIS — Z8659 Personal history of other mental and behavioral disorders: Secondary | ICD-10-CM

## 2013-03-27 DIAGNOSIS — O99284 Endocrine, nutritional and metabolic diseases complicating childbirth: Principal | ICD-10-CM

## 2013-03-27 DIAGNOSIS — D649 Anemia, unspecified: Secondary | ICD-10-CM | POA: Diagnosis not present

## 2013-03-27 DIAGNOSIS — E079 Disorder of thyroid, unspecified: Principal | ICD-10-CM | POA: Diagnosis present

## 2013-03-27 DIAGNOSIS — IMO0001 Reserved for inherently not codable concepts without codable children: Secondary | ICD-10-CM

## 2013-03-27 DIAGNOSIS — O9903 Anemia complicating the puerperium: Secondary | ICD-10-CM | POA: Diagnosis not present

## 2013-03-27 LAB — CBC
HCT: 34 % — ABNORMAL LOW (ref 36.0–46.0)
Hemoglobin: 12 g/dL (ref 12.0–15.0)
MCH: 30.5 pg (ref 26.0–34.0)
MCHC: 35.3 g/dL (ref 30.0–36.0)
MCV: 86.3 fL (ref 78.0–100.0)
PLATELETS: 187 10*3/uL (ref 150–400)
RBC: 3.94 MIL/uL (ref 3.87–5.11)
RDW: 13.4 % (ref 11.5–15.5)
WBC: 12.2 10*3/uL — ABNORMAL HIGH (ref 4.0–10.5)

## 2013-03-27 LAB — RPR: RPR: NONREACTIVE

## 2013-03-27 LAB — AMNISURE RUPTURE OF MEMBRANE (ROM) NOT AT ARMC: Amnisure ROM: POSITIVE

## 2013-03-27 MED ORDER — IBUPROFEN 600 MG PO TABS
600.0000 mg | ORAL_TABLET | Freq: Four times a day (QID) | ORAL | Status: DC
  Administered 2013-03-28 (×3): 600 mg via ORAL
  Filled 2013-03-27 (×3): qty 1

## 2013-03-27 MED ORDER — WITCH HAZEL-GLYCERIN EX PADS: 1.0000 "application " | MEDICATED_PAD | CUTANEOUS | Status: DC | PRN

## 2013-03-27 MED ORDER — ONDANSETRON HCL 4 MG/2ML IJ SOLN: 4.0000 mg | INTRAMUSCULAR | Status: DC | PRN

## 2013-03-27 MED ORDER — DIBUCAINE 1 % RE OINT
1.0000 "application " | TOPICAL_OINTMENT | RECTAL | Status: DC | PRN
  Filled 2013-03-27: qty 28

## 2013-03-27 MED ORDER — LIDOCAINE HCL (PF) 1 % IJ SOLN
30.0000 mL | INTRAMUSCULAR | Status: DC | PRN
  Filled 2013-03-27: qty 30

## 2013-03-27 MED ORDER — FERROUS SULFATE 325 (65 FE) MG PO TABS
325.0000 mg | ORAL_TABLET | Freq: Two times a day (BID) | ORAL | Status: DC
  Administered 2013-03-27 – 2013-03-28 (×2): 325 mg via ORAL
  Filled 2013-03-27 (×4): qty 1

## 2013-03-27 MED ORDER — SIMETHICONE 80 MG PO CHEW
80.0000 mg | CHEWABLE_TABLET | ORAL | Status: DC | PRN
Start: 2013-03-27 — End: 2013-03-28

## 2013-03-27 MED ORDER — TETANUS-DIPHTH-ACELL PERTUSSIS 5-2.5-18.5 LF-MCG/0.5 IM SUSP
0.5000 mL | Freq: Once | INTRAMUSCULAR | Status: DC
Start: 2013-03-28 — End: 2013-03-28
  Filled 2013-03-27: qty 0.5

## 2013-03-27 MED ORDER — ONDANSETRON HCL 4 MG/2ML IJ SOLN: 4.0000 mg | Freq: Four times a day (QID) | INTRAMUSCULAR | Status: DC | PRN

## 2013-03-27 MED ORDER — DIPHENHYDRAMINE HCL 25 MG PO CAPS
25.0000 mg | ORAL_CAPSULE | Freq: Four times a day (QID) | ORAL | Status: DC | PRN
Start: 2013-03-27 — End: 2013-03-28

## 2013-03-27 MED ORDER — LACTATED RINGERS IV SOLN
INTRAVENOUS | Status: DC
  Administered 2013-03-27: 125 mL/h via INTRAVENOUS

## 2013-03-27 MED ORDER — LACTATED RINGERS IV SOLN: 500.0000 mL | INTRAVENOUS | Status: DC | PRN

## 2013-03-27 MED ORDER — ACETAMINOPHEN 325 MG PO TABS: 650.0000 mg | ORAL_TABLET | ORAL | Status: DC | PRN

## 2013-03-27 MED ORDER — IBUPROFEN 600 MG PO TABS
600.0000 mg | ORAL_TABLET | Freq: Four times a day (QID) | ORAL | Status: DC | PRN
Start: 2013-03-27 — End: 2013-03-27
  Administered 2013-03-27: 600 mg via ORAL
  Filled 2013-03-27: qty 1

## 2013-03-27 MED ORDER — PRENATAL MULTIVITAMIN CH
1.0000 | ORAL_TABLET | Freq: Every day | ORAL | Status: DC
  Administered 2013-03-28: 1 via ORAL
  Filled 2013-03-27: qty 1

## 2013-03-27 MED ORDER — OXYCODONE-ACETAMINOPHEN 5-325 MG PO TABS
1.0000 | ORAL_TABLET | ORAL | Status: DC | PRN
  Administered 2013-03-27 – 2013-03-28 (×3): 1 via ORAL
  Filled 2013-03-27 (×3): qty 1

## 2013-03-27 MED ORDER — OXYTOCIN 10 UNIT/ML IJ SOLN
INTRAMUSCULAR | Status: AC
  Administered 2013-03-27: 10 [IU] via INTRAMUSCULAR
  Filled 2013-03-27: qty 1

## 2013-03-27 MED ORDER — OXYTOCIN BOLUS FROM INFUSION: 500.0000 mL | INTRAVENOUS | Status: DC

## 2013-03-27 MED ORDER — OXYCODONE-ACETAMINOPHEN 5-325 MG PO TABS
1.0000 | ORAL_TABLET | ORAL | Status: DC | PRN
Start: 2013-03-27 — End: 2013-03-27
  Administered 2013-03-27 (×2): 1 via ORAL
  Filled 2013-03-27 (×2): qty 1

## 2013-03-27 MED ORDER — BENZOCAINE-MENTHOL 20-0.5 % EX AERO
1.0000 "application " | INHALATION_SPRAY | CUTANEOUS | Status: DC | PRN
  Filled 2013-03-27: qty 56

## 2013-03-27 MED ORDER — ZOLPIDEM TARTRATE 5 MG PO TABS: 5.0000 mg | ORAL_TABLET | Freq: Every evening | ORAL | Status: DC | PRN

## 2013-03-27 MED ORDER — LEVOTHYROXINE SODIUM 50 MCG PO TABS
50.0000 ug | ORAL_TABLET | Freq: Every day | ORAL | Status: DC
Start: 2013-03-28 — End: 2013-03-27
  Filled 2013-03-27: qty 1

## 2013-03-27 MED ORDER — ONDANSETRON HCL 4 MG PO TABS: 4.0000 mg | ORAL_TABLET | ORAL | Status: DC | PRN

## 2013-03-27 MED ORDER — SENNOSIDES-DOCUSATE SODIUM 8.6-50 MG PO TABS
2.0000 | ORAL_TABLET | ORAL | Status: DC
  Administered 2013-03-28: 2 via ORAL
  Filled 2013-03-27: qty 2

## 2013-03-27 MED ORDER — LEVOTHYROXINE SODIUM 50 MCG PO TABS: 50.0000 ug | ORAL_TABLET | Freq: Every day | ORAL | Status: DC

## 2013-03-27 MED ORDER — CITRIC ACID-SODIUM CITRATE 334-500 MG/5ML PO SOLN: 30.0000 mL | ORAL | Status: DC | PRN

## 2013-03-27 MED ORDER — LANOLIN HYDROUS EX OINT: TOPICAL_OINTMENT | CUTANEOUS | Status: DC | PRN

## 2013-03-27 MED ORDER — OXYTOCIN 40 UNITS IN LACTATED RINGERS INFUSION - SIMPLE MED
62.5000 mL/h | INTRAVENOUS | Status: DC
  Filled 2013-03-27: qty 1000

## 2013-03-27 NOTE — MAU Note (Signed)
Regular UC's Q 5 mins since 0730. Pink tinged watery discharge. States was 3.5 cm and 50% eff 3 days ago.

## 2013-03-27 NOTE — H&P (Signed)
Carol Ayala is a 28 y.o. female, [id number] at 4 4/7 weeks, presenting for Active labor with SROM. Denies VB,  recent fever, resp or GI c/o's,UTI or PIH s/s . GFM. Desires WB.  Patient Active Problem List   Diagnosis Date Noted  . Active labor 03/27/2013  . NSVD (normal spontaneous vaginal delivery) 03/27/2013  . Unspecified hypothyroidism 03/27/2013  . History of depression 03/27/2013  . History of anxiety 03/27/2013  . SAB (spontaneous abortion) 05/01/2012    History of present pregnancy: Patient entered care at 7 2/7 weeks.   EDC of 03/30/2013 was established by LMP.   Anatomy scan: 86 2/7weeks, with normal findings and an anterior placenta.   Additional [place] evaluations:  None.   Significant prenatal events:  None   Last evaluation:  03/24/2013 VE: 3/50/-2    OB History   Grav Para Term Preterm Abortions TAB SAB Ect Mult Living   4 2 2       2      Obstetric Comments   2012  OLIGO; FETAL BILTERAL PYELECTASIS; DOUBLE URETERS; PT INCREASED BP; PROLONGED HEALING OF LACERATION     Past Medical History  Diagnosis Date  . Thyroid disease     on synthroid   . Hypothyroidism 2009    [facility]  . Anxiety 2010    CELEXA X 1 YR  . Depression 2009    CELEXA X 1 YR; HAD COUNSELING  . Pregnancy induced hypertension 2012  . Asthma     CHILDHOOD  . Headache(784.0) AS TEEN    TEEN AND WITH DEPRESSSION  . History of TMJ disorder   . Seasonal allergies   . Tendonitis    Past Surgical History  Procedure Laterality Date  . Tonsillectomy  CHILDHOOD  . Wisdom teeth  AGE 37   Family History: family history includes Anemia in her mother; Arthritis in her paternal grandmother; Cancer in her paternal grandmother; Depression in her mother; Emphysema in her maternal grandfather; Other in her paternal grandmother; Thyroid disease in her maternal aunt and paternal grandmother. Social History:  reports that she has never smoked. She has never used smokeless tobacco. She  reports that she does not drink alcohol or use illicit drugs.   Prenatal Transfer Tool  Maternal Diabetes: No Genetic Screening: Declined Maternal Ultrasounds/Referrals: Normal Fetal Ultrasounds or other Referrals:  None Maternal Substance Abuse:  No Significant Maternal Medications:  Meds include: Syntroid Significant Maternal Lab Results: None    ROS: see HPI above, all other systems are negative   Allergies  Allergen Reactions  . Other     STRAWBERRIES; CHERRIES - HIVES  . Penicillins Hives      Blood pressure 105/68, pulse 99, temperature 98.5 F (36.9 C), temperature source Oral, resp. rate 18, height 5\' 3"  (1.6 m), weight 195 lb (88.451 kg), unknown if currently breastfeeding.  Chest clear Heart RRR without murmur Abd gravid, NT, Pelvic: SVE: 6/100/-2 Ext: WNL  FHR: Cat I UCs:    Prenatal labs: ABO, Rh:  B+ Antibody:  Neg Rubella:   Imm RPR: NON REACTIVE (01/30 1115)  HBsAg:   Neg HIV: Non-reactive (07/28 0000)  GBS: Negative (01/20 0000) Sickle cell/Hgb electrophoresis:  N/A Pap:  WNL-09/30/2012 GC:  Neg  Chlamydia:  Neg  Genetic screenings:  Declined  Glucola:  143, Normal 3hr Other:  TSH: 7/28 & 11/05     Assessment/Plan: IUP at 39 4/7wks Active Labor SROM GBS Neg Desires WB Hypothyroidism  Plan: Admit to birthing suites per consult with  Dr. Ernestina PatchesA. Stringer Routine L&D orders Continue to assess labor progress   Rowan Ayala, [name], [name] 03/27/2013, 6:07 PM

## 2013-03-27 NOTE — Progress Notes (Signed)

## 2013-03-27 NOTE — Lactation Note (Signed)
This note was copied from the chart of Carol Ayala [place]. Lactation Consultation Note Initial visit at 7 hours of age.  Mom reports breast feeding is going well and likes football hold because baby is bid.  FOB holding baby skin to skin now.  Discussed feeding cues, skin to skin and cluster feeding.  Mom is able to hand express colostrum.  [facility] LC resources given and discussed.  Encouraged mom to call for assist as needed.   Patient Name: Carol Ayala Krull [id number]'s Date: 03/27/2013 Reason for consult: Initial assessment   Maternal Data Formula Feeding for Exclusion: No Has patient been taught Hand Expression?: Yes Does the patient have breastfeeding experience prior to this delivery?: Yes  Feeding Feeding Type: Breast Fed Length of feed: 10 min  LATCH Score/Interventions                Intervention(s): Breastfeeding basics reviewed     Lactation Tools Discussed/Used     Consult Status Consult Status: Follow-up Date: 03/28/13 Follow-up type: In-patient    Glada Wickstrom, Arvella MerlesJana Lynn 03/27/2013, 10:50 PM

## 2013-03-28 LAB — CBC
HCT: 30.8 % — ABNORMAL LOW (ref 36.0–46.0)
Hemoglobin: 10.5 g/dL — ABNORMAL LOW (ref 12.0–15.0)
MCH: 29.6 pg (ref 26.0–34.0)
MCHC: 34.1 g/dL (ref 30.0–36.0)
MCV: 86.8 fL (ref 78.0–100.0)
Platelets: 188 10*3/uL (ref 150–400)
RBC: 3.55 MIL/uL — AB (ref 3.87–5.11)
RDW: 13.5 % (ref 11.5–15.5)
WBC: 13.6 10*3/uL — AB (ref 4.0–10.5)

## 2013-03-28 MED ORDER — OXYCODONE-ACETAMINOPHEN 5-325 MG PO TABS: 1.0000 | ORAL_TABLET | ORAL | Status: AC | PRN

## 2013-03-28 MED ORDER — IBUPROFEN 800 MG PO TABS: 800.0000 mg | ORAL_TABLET | Freq: Three times a day (TID) | ORAL | Status: AC | PRN

## 2013-03-28 MED ORDER — FERROUS SULFATE 325 (65 FE) MG PO TABS: 325.0000 mg | ORAL_TABLET | Freq: Two times a day (BID) | ORAL | Status: AC

## 2013-03-28 NOTE — Discharge Summary (Signed)
Vaginal Delivery Discharge Summary  Carol HuWhitney Trumpower  DOB:    Sep 07, 1985 MRN:    [id number] CSN:    [id number]  Date of admission:                  03/27/2013  Date of discharge:                   03/28/2013  Procedures this admission:  Date of Delivery: 03/27/2013 normal spontaneous vaginal delivery by Haroldine LawsJennifer Oxley, certified nurse midwife  Newborn Data:  Live born female  Birth Weight: 9 lb 9.8 oz (4360 g) APGAR: 2, 8  Home with mother. Name: [name] Circumcision Plan: In-hospital  History of Present Illness:  Ms. Carol Ayala is a 28 y.o. female, [phone], who presents at 6988w4d weeks gestation. [facility]. She was admitted onset of labor and rupture of membranes. Her pregnancy has been complicated by:   Hypothyroidism History of depression History of anxiety Anemia.  Hospital course:  The patient was admitted for active labor. She labored in the water birth.   Her labor was not complicated. She proceeded to have a vaginal delivery of a healthy infant. Her delivery was complicated. There was a shoulder dystocia. There were no fetal or maternal complications however. Her postpartum course was not complicated. She was discharged to home on postpartum day 1 doing well.  Feeding:  breast  Contraception:  condoms  Discharge hemoglobin:  Hemoglobin  Date Value Range Status  03/28/2013 10.5* 12.0 - 15.0 g/dL Final     HCT  Date Value Range Status  03/28/2013 30.8* 36.0 - 46.0 % Final    Discharge Physical Exam:   General: no distress Lochia: appropriate Uterine Fundus: firm Incision: NA DVT Evaluation: No evidence of DVT seen on physical exam.  Intrapartum Procedures: spontaneous vaginal delivery and Management of shoulder dystocia. Postpartum Procedures: none Complications-Operative and Postpartum: none  Discharge Diagnoses: Term  Pregnancy-delivered and Anemia, shoulder dystocia, hypothyroidism, history of depression, history of anxiety  Discharge Information:  Activity:           pelvic rest Diet:                routine Medications: PNV, Ibuprofen, Iron and Percocet Condition:      stable and improved Instructions:   Postpartum Care After Vaginal Delivery  After you deliver your newborn (postpartum period), the usual stay in the hospital is 24 72 hours. If there were problems with your labor or delivery, or if you have other medical problems, you might be in the hospital longer.  While you are in the hospital, you will receive help and instructions on how to care for yourself and your newborn during the postpartum period.  While you are in the hospital:  Be sure to tell your nurses if you have pain or discomfort, as well as where you feel the pain and what makes the pain worse.  If you had an incision made near your vagina (episiotomy) or if you had some tearing during delivery, the nurses may put ice packs on your episiotomy or tear. The ice packs may help to reduce the pain and swelling.  If you are breastfeeding, you may feel uncomfortable contractions of your uterus for a couple of weeks. This is normal. The contractions help your uterus get back to normal size.  It is normal to have some bleeding after delivery.  For the first  1 3 days after delivery, the flow is red and the amount may be similar to a period.  It is common for the flow to start and stop.  In the first few days, you may pass some small clots. Let your nurses know if you begin to pass large clots or your flow increases.  Do not  flush blood clots down the toilet before having the nurse look at them.  During the next 3 10 days after delivery, your flow should become more watery and pink or brown-tinged in color.  Ten to fourteen days after delivery, your flow should be a small amount of yellowish-white discharge.  The amount of your flow  will decrease over the first few weeks after delivery. Your flow may stop in 6 8 weeks. Most women have had their flow stop by 12 weeks after delivery.  You should change your sanitary pads frequently.  Wash your hands thoroughly with soap and water for at least 20 seconds after changing pads, using the toilet, or before holding or feeding your newborn.  You should feel like you need to empty your bladder within the first 6 8 hours after delivery.  In case you become weak, lightheaded, or faint, call your nurse before you get out of bed for the first time and before you take a shower for the first time.  Within the first few days after delivery, your breasts may begin to feel tender and full. This is called engorgement. Breast tenderness usually goes away within 48 72 hours after engorgement occurs. You may also notice milk leaking from your breasts. If you are not breastfeeding, do not stimulate your breasts. Breast stimulation can make your breasts produce more milk.  Spending as much time as possible with your newborn is very important. During this time, you and your newborn can feel close and get to know each other. Having your newborn stay in your room (rooming in) will help to strengthen the bond with your newborn. It will give you time to get to know your newborn and become comfortable caring for your newborn.  Your hormones change after delivery. Sometimes the hormone changes can temporarily cause you to feel sad or tearful. These feelings should not last more than a few days. If these feelings last longer than that, you should talk to your caregiver.  If desired, talk to your caregiver about methods of family planning or contraception.  Talk to your caregiver about immunizations. Your caregiver may want you to have the following immunizations before leaving the hospital:  Tetanus, diphtheria, and pertussis (Tdap) or tetanus and diphtheria (Td) immunization. It is very important that you  and your family (including grandparents) or others caring for your newborn are up-to-date with the Tdap or Td immunizations. The Tdap or Td immunization can help protect your newborn from getting ill.  Rubella immunization.  Varicella (chickenpox) immunization.  Influenza immunization. You should receive this annual immunization if you did not receive the immunization during your pregnancy. Document Released: 12/10/2006 Document Revised: 11/07/2011 Document Reviewed: 10/10/2011 [facility] Patient Information 2014 [place], [place].   Postpartum Depression and Baby Blues  The postpartum period begins right after the birth of a baby. During this time, there is often a great amount of joy and excitement. It is also a time of considerable changes in the life of the parent(s). Regardless of how many times a mother gives birth, each child brings new challenges and dynamics to the family. It is not unusual to have feelings  of excitement accompanied by confusing shifts in moods, emotions, and thoughts. All mothers are at risk of developing postpartum depression or the "baby blues." These mood changes can occur right after giving birth, or they may occur many months after giving birth. The baby blues or postpartum depression can be mild or severe. Additionally, postpartum depression can resolve rather quickly, or it can be a long-term condition. CAUSES Elevated hormones and their rapid decline are thought to be a main cause of postpartum depression and the baby blues. There are a number of hormones that radically change during and after pregnancy. Estrogen and progesterone usually decrease immediately after delivering your baby. The level of thyroid hormone and various cortisol steroids also rapidly drop. Other factors that play a major role in these changes include major life events and genetics.  RISK FACTORS If you have any of the following risks for the baby blues or postpartum depression, know what symptoms  to watch out for during the postpartum period. Risk factors that may increase the likelihood of getting the baby blues or postpartum depression include:  Havinga personal or family history of depression.  Having depression while being pregnant.  Having premenstrual or oral contraceptive-associated mood issues.  Having exceptional life stress.  Having marital conflict.  Lacking a social support network.  Having a baby with special needs.  Having health problems such as diabetes. SYMPTOMS Baby blues symptoms include:  Brief fluctuations in mood, such as going from extreme happiness to sadness.  Decreased concentration.  Difficulty sleeping.  Crying spells, tearfulness.  Irritability.  Anxiety. Postpartum depression symptoms typically begin within the first month after giving birth. These symptoms include:  Difficulty sleeping or excessive sleepiness.  Marked weight loss.  Agitation.  Feelings of worthlessness.  Lack of interest in activity or food. Postpartum psychosis is a very concerning condition and can be dangerous. Fortunately, it is rare. Displaying any of the following symptoms is cause for immediate medical attention. Postpartum psychosis symptoms include:  Hallucinations and delusions.  Bizarre or disorganized behavior.  Confusion or disorientation. DIAGNOSIS  A diagnosis is made by an evaluation of your symptoms. There are no medical or lab tests that lead to a diagnosis, but there are various questionnaires that a caregiver may use to identify those with the baby blues, postpartum depression, or psychosis. Often times, a screening tool called the [place] Postnatal Depression Scale is used to diagnose depression in the postpartum period.  TREATMENT The baby blues usually goes away on its own in 1 to 2 weeks. Social support is often all that is needed. You should be encouraged to get adequate sleep and rest. Occasionally, you may be given medicines to  help you sleep.  Postpartum depression requires treatment as it can last several months or longer if it is not treated. Treatment may include individual or group therapy, medicine, or both to address any social, physiological, and psychological factors that may play a role in the depression. Regular exercise, a healthy diet, rest, and social support may also be strongly recommended.  Postpartum psychosis is more serious and needs treatment right away. Hospitalization is often needed. HOME CARE INSTRUCTIONS  Get as much rest as you can. Nap when the baby sleeps.  Exercise regularly. Some women find yoga and walking to be beneficial.  Eat a balanced and nourishing diet.  Do little things that you enjoy. Have a cup of tea, take a bubble bath, read your favorite magazine, or listen to your favorite music.  Avoid alcohol.  Ask  for help with household chores, cooking, grocery shopping, or running errands as needed. Do not try to do everything.  Talk to people close to you about how you are feeling. Get support from your partner, family members, friends, or other new moms.  Try to stay positive in how you think. Think about the things you are grateful for.  Do not spend a lot of time alone.  Only take medicine as directed by your caregiver.  Keep all your postpartum appointments.  Let your caregiver know if you have any concerns. SEEK MEDICAL CARE IF: You are having a reaction or problems with your medicine. SEEK IMMEDIATE MEDICAL CARE IF:  You have suicidal feelings.  You feel you may harm the baby or someone else. Document Released: 11/17/2003 Document Revised: 05/07/2011 Document Reviewed: 12/19/2010 [facility] Patient Information 2014 [place], [place].   Discharge to: home  Follow-up Information   Follow up with [facility] In 2 weeks.   Contact information:   [address] [place] [address]        Janine Limbo 03/28/2013

## 2013-03-28 NOTE — Discharge Instructions (Signed)
Postpartum Depression and Baby Blues °The postpartum period begins right after the birth of a baby. During this time, there is often a great amount of joy and excitement. It is also a time of considerable changes in the life of the parent(s). Regardless of how many times a mother gives birth, each child brings new challenges and dynamics to the family. It is not unusual to have feelings of excitement accompanied by confusing shifts in moods, emotions, and thoughts. All mothers are at risk of developing postpartum depression or the "baby blues." These mood changes can occur right after giving birth, or they may occur many months after giving birth. The baby blues or postpartum depression can be mild or severe. Additionally, postpartum depression can resolve rather quickly, or it can be a long-term condition. °CAUSES °Elevated hormones and their rapid decline are thought to be a main cause of postpartum depression and the baby blues. There are a number of hormones that radically change during and after pregnancy. Estrogen and progesterone usually decrease immediately after delivering your baby. The level of thyroid hormone and various cortisol steroids also rapidly drop. Other factors that play a major role in these changes include major life events and genetics.  °RISK FACTORS °If you have any of the following risks for the baby blues or postpartum depression, know what symptoms to watch out for during the postpartum period. Risk factors that may increase the likelihood of getting the baby blues or postpartum depression include: °· Having a personal or family history of depression. °· Having depression while being pregnant. °· Having premenstrual or oral contraceptive-associated mood issues. °· Having exceptional life stress. °· Having marital conflict. °· Lacking a social support network. °· Having a baby with special needs. °· Having health problems such as diabetes. °SYMPTOMS °Baby blues symptoms include: °· Brief  fluctuations in mood, such as going from extreme happiness to sadness. °· Decreased concentration. °· Difficulty sleeping. °· Crying spells, tearfulness. °· Irritability. °· Anxiety. °Postpartum depression symptoms typically begin within the first month after giving birth. These symptoms include: °· Difficulty sleeping or excessive sleepiness. °· Marked weight loss. °· Agitation. °· Feelings of worthlessness. °· Lack of interest in activity or food. °Postpartum psychosis is a very concerning condition and can be dangerous. Fortunately, it is rare. Displaying any of the following symptoms is cause for immediate medical attention. Postpartum psychosis symptoms include: °· Hallucinations and delusions. °· Bizarre or disorganized behavior. °· Confusion or disorientation. °DIAGNOSIS  °A diagnosis is made by an evaluation of your symptoms. There are no medical or lab tests that lead to a diagnosis, but there are various questionnaires that a caregiver may use to identify those with the baby blues, postpartum depression, or psychosis. Often times, a screening tool called the [place] Postnatal Depression Scale is used to diagnose depression in the postpartum period.  °TREATMENT °The baby blues usually goes away on its own in 1 to 2 weeks. Social support is often all that is needed. You should be encouraged to get adequate sleep and rest. Occasionally, you may be given medicines to help you sleep.  °Postpartum depression requires treatment as it can last several months or longer if it is not treated. Treatment may include individual or group therapy, medicine, or both to address any social, physiological, and psychological factors that may play a role in the depression. Regular exercise, a healthy diet, rest, and social support may also be strongly recommended.  °Postpartum psychosis is more serious and needs treatment right away. Hospitalization is   often needed. °HOME CARE INSTRUCTIONS °· Get as much rest as you can. Nap  when the baby sleeps. °· Exercise regularly. Some women find yoga and walking to be beneficial. °· Eat a balanced and nourishing diet. °· Do little things that you enjoy. Have a cup of tea, take a bubble bath, read your favorite magazine, or listen to your favorite music. °· Avoid alcohol. °· Ask for help with household chores, cooking, grocery shopping, or running errands as needed. Do not try to do everything. °· Talk to people close to you about how you are feeling. Get support from your partner, family members, friends, or other new moms. °· Try to stay positive in how you think. Think about the things you are grateful for. °· Do not spend a lot of time alone. °· Only take medicine as directed by your caregiver. °· Keep all your postpartum appointments. °· Let your caregiver know if you have any concerns. °SEEK MEDICAL CARE IF: °You are having a reaction or problems with your medicine. °SEEK IMMEDIATE MEDICAL CARE IF: °· You have suicidal feelings. °· You feel you may harm the baby or someone else. °Document Released: 11/17/2003 Document Revised: 05/07/2011 Document Reviewed: 11/24/2012 °Exit[employer]® Patient Information ©2014 [employer]. °Postpartum Care After Vaginal Delivery °After you deliver your newborn (postpartum period), the usual stay in the hospital is 24 72 hours. If there were problems with your labor or delivery, or if you have other medical problems, you might be in the hospital longer.  °While you are in the hospital, you will receive help and instructions on how to care for yourself and your newborn during the postpartum period.  °While you are in the hospital: °· Be sure to tell your nurses if you have pain or discomfort, as well as where you feel the pain and what makes the pain worse. °· If you had an incision made near your vagina (episiotomy) or if you had some tearing during delivery, the nurses may put ice packs on your episiotomy or tear. The ice packs may help to reduce the pain and  swelling. °· If you are breastfeeding, you may feel uncomfortable contractions of your uterus for a couple of weeks. This is normal. The contractions help your uterus get back to normal size. °· It is normal to have some bleeding after delivery. °· For the first 1 3 days after delivery, the flow is red and the amount may be similar to a period. °· It is common for the flow to start and stop. °· In the first few days, you may pass some small clots. Let your nurses know if you begin to pass large clots or your flow increases. °· Do not  flush blood clots down the toilet before having the nurse look at them. °· During the next 3 10 days after delivery, your flow should become more watery and pink or brown-tinged in color. °· Ten to fourteen days after delivery, your flow should be a small amount of yellowish-white discharge. °· The amount of your flow will decrease over the first few weeks after delivery. Your flow may stop in 6 8 weeks. Most women have had their flow stop by 12 weeks after delivery. °· You should change your sanitary pads frequently. °· Wash your hands thoroughly with soap and water for at least 20 seconds after changing pads, using the toilet, or before holding or feeding your newborn. °· You should feel like you need to empty your bladder within the first   6 8 hours after delivery. °· In case you become weak, lightheaded, or faint, call your nurse before you get out of bed for the first time and before you take a shower for the first time. °· Within the first few days after delivery, your breasts may begin to feel tender and full. This is called engorgement. Breast tenderness usually goes away within 48 72 hours after engorgement occurs. You may also notice milk leaking from your breasts. If you are not breastfeeding, do not stimulate your breasts. Breast stimulation can make your breasts produce more milk. °· Spending as much time as possible with your newborn is very important. During this time,  you and your newborn can feel close and get to know each other. Having your newborn stay in your room (rooming in) will help to strengthen the bond with your newborn.  It will give you time to get to know your newborn and become comfortable caring for your newborn. °· Your hormones change after delivery. Sometimes the hormone changes can temporarily cause you to feel sad or tearful. These feelings should not last more than a few days. If these feelings last longer than that, you should talk to your caregiver. °· If desired, talk to your caregiver about methods of family planning or contraception. °· Talk to your caregiver about immunizations. Your caregiver may want you to have the following immunizations before leaving the hospital: °· Tetanus, diphtheria, and pertussis (Tdap) or tetanus and diphtheria (Td) immunization. It is very important that you and your family (including grandparents) or others caring for your newborn are up-to-date with the Tdap or Td immunizations. The Tdap or Td immunization can help protect your newborn from getting ill. °· Rubella immunization. °· Varicella (chickenpox) immunization. °· Influenza immunization. You should receive this annual immunization if you did not receive the immunization during your pregnancy. °Document Released: 12/10/2006 Document Revised: 11/07/2011 Document Reviewed: 10/10/2011 °ExitCare® Patient Information ©2014 [employer]. ° °

## 2013-12-28 ENCOUNTER — Encounter (HOSPITAL_COMMUNITY): Payer: Self-pay | Admitting: *Deleted

## 2014-07-11 IMAGING — US US OB COMP LESS 14 WK
1 series · 13 of 28 positions shown · non-contrast
Comparison: None.

CLINICAL DATA: Positive pregnancy test with vaginal bleeding.

OBSTETRIC <14 WK US AND TRANSVAGINAL OB US
TECHNIQUE: Both transabdominal and transvaginal ultrasound
examinations were performed for complete evaluation of the
gestation as well as the maternal uterus, adnexal regions, and
pelvic cul-de-sac.  Transvaginal technique was performed to assess
early pregnancy.

[Series 1: us ob comp less 14 wks · 30 acquisitions, 13 frames shown]
[im 2/30]
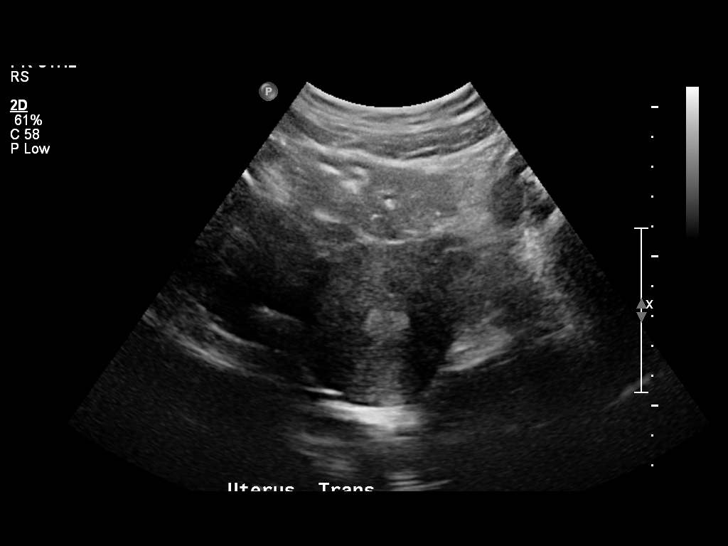
[im 4/30]
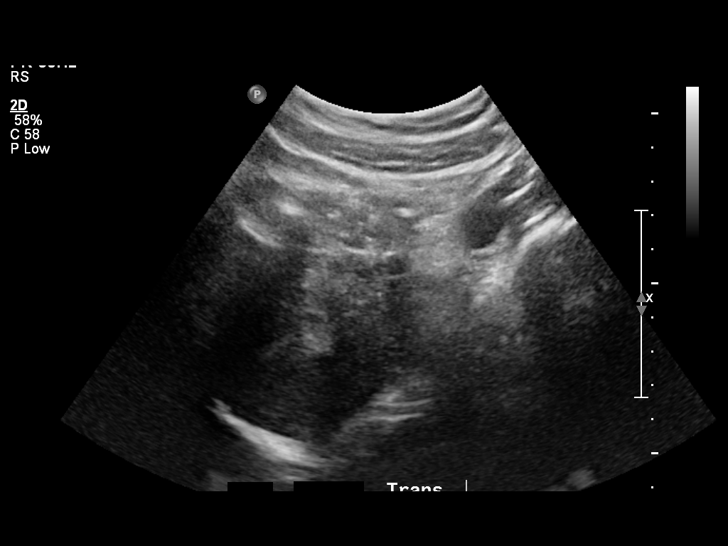
[im 6/30]
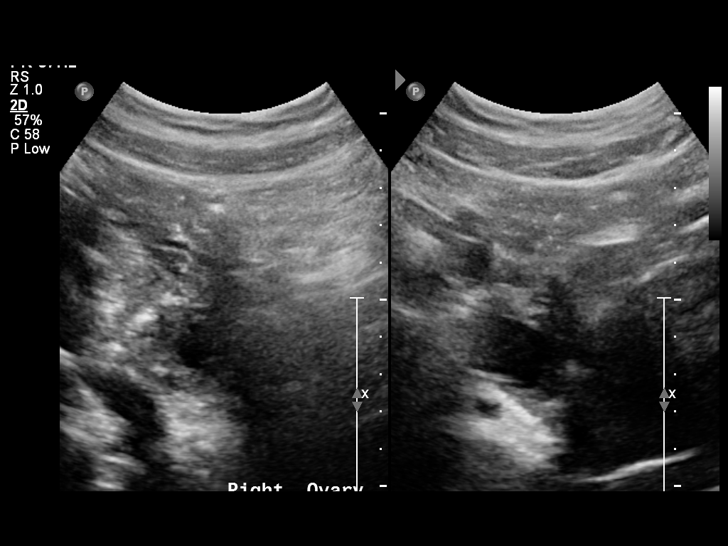
[im 8/30]
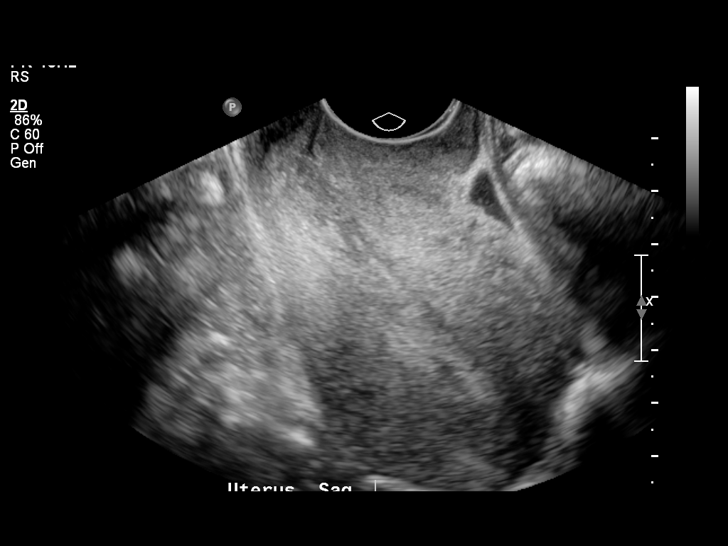
[im 10/30]
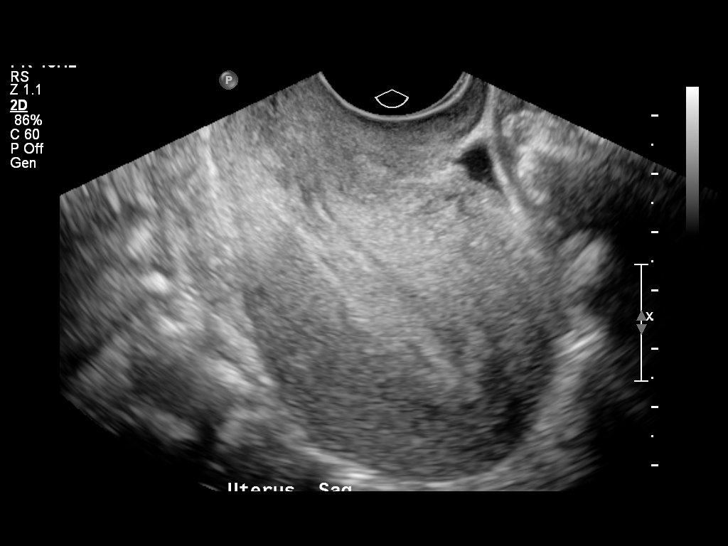
[im 12/30]
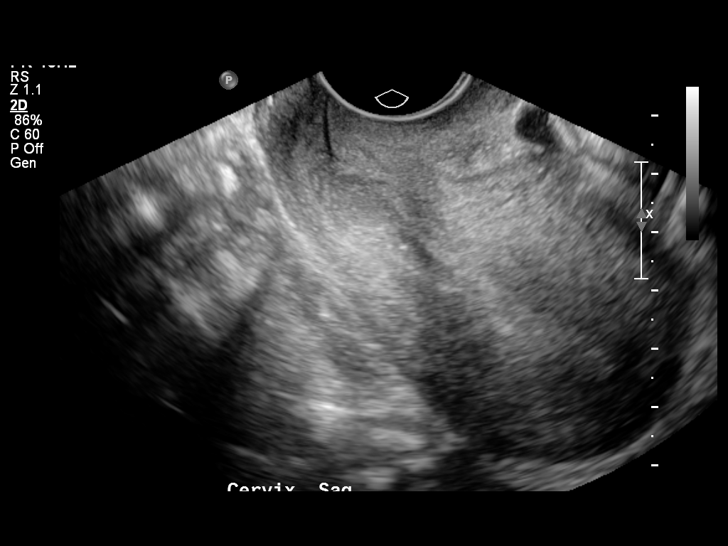
[im 16/30]
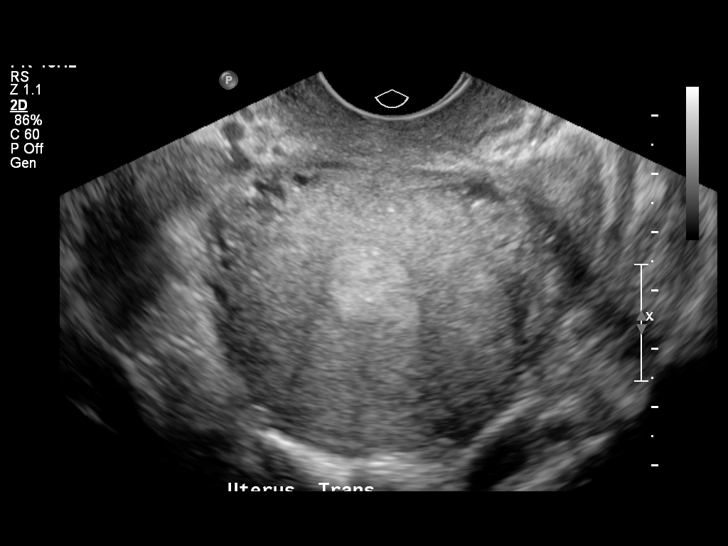
[im 18/30]
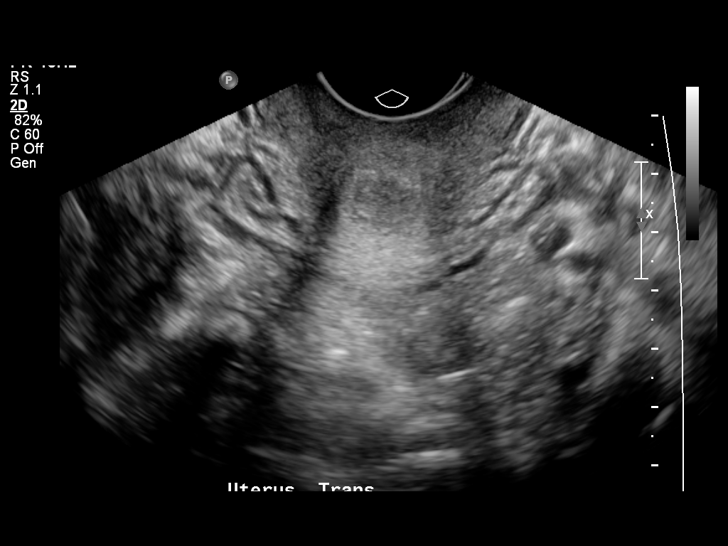
[im 20/30]
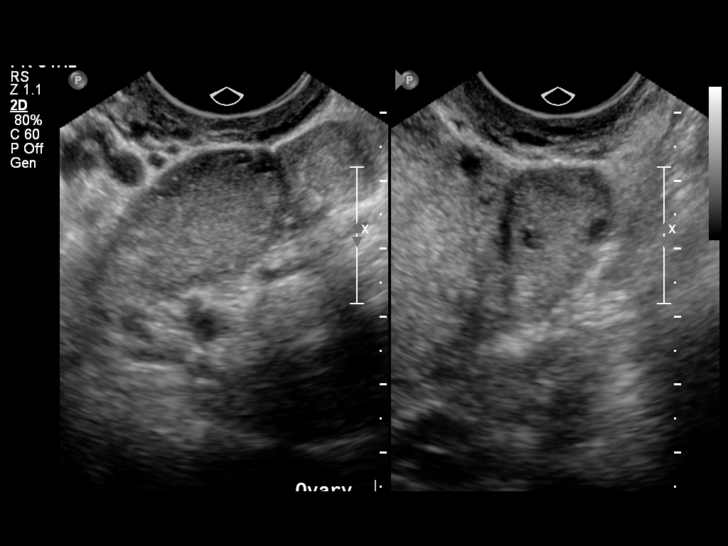
[im 22/30]
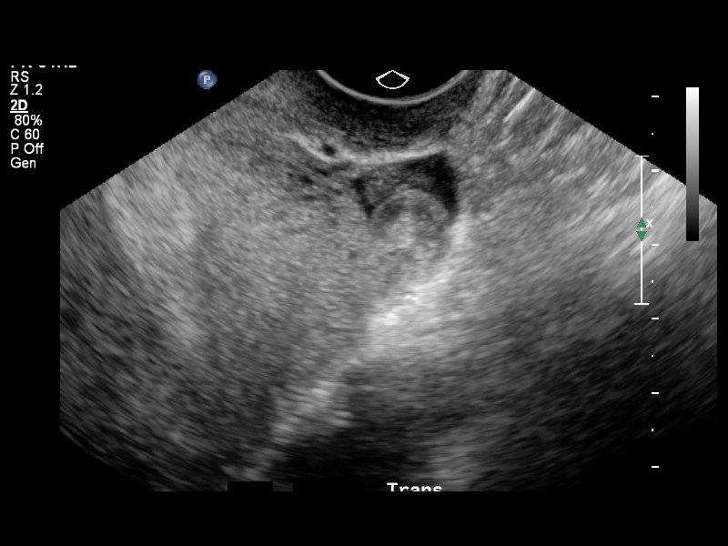
[im 24/30]
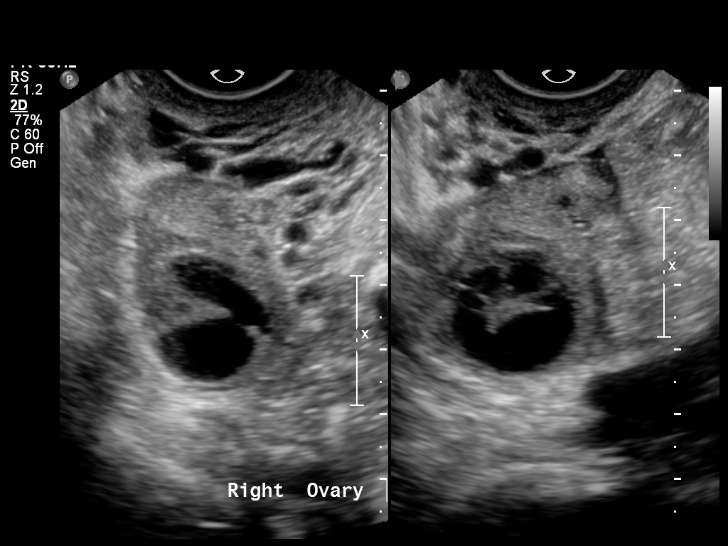
[im 26/30]
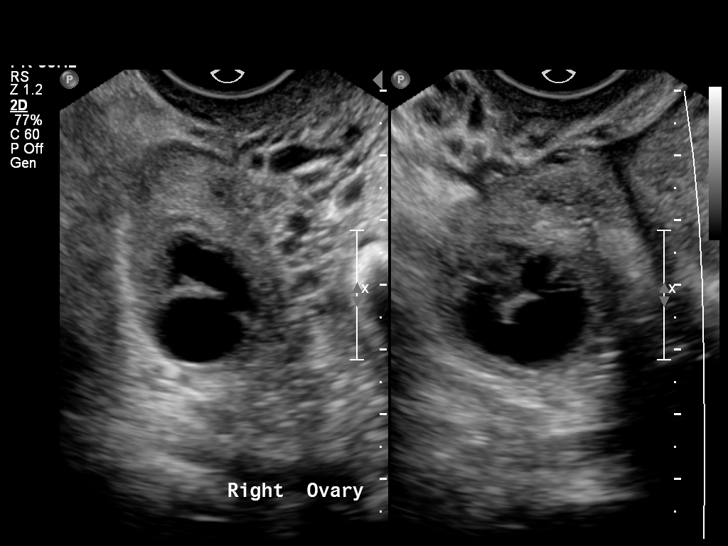
[im 28/30]
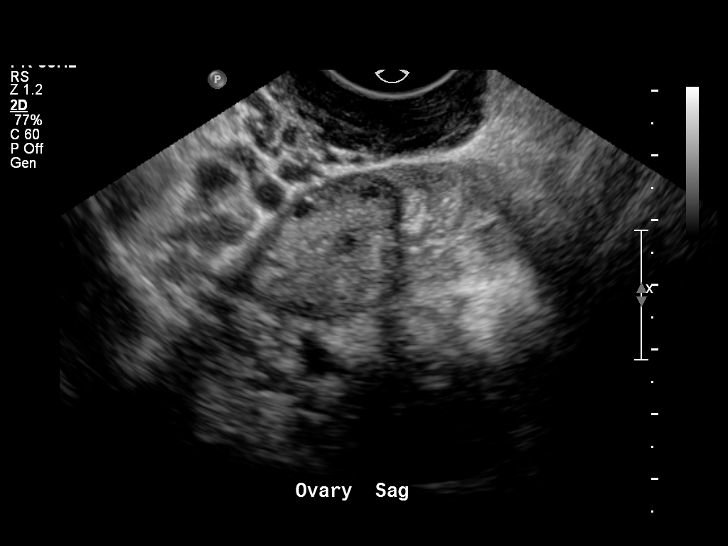

[13 of 28 positions shown; findings below may reference images not displayed]

Intrauterine gestational sac:  None visualized
Yolk sac: N/A
Embryo: N/and

Maternal uterus/adnexae:
Uterus is retroflexed.  The endometrium appears slightly thickened
and is heterogeneous in the lower uterine segment tracking down
towards the cervix.

The left ovary measures 2.7 x 1.9 x 1.7 cm and is sonographically
normal.  The right ovary measures 4.0 x 2.8 x 2.1 cm.  A 2.3 cm
complex cystic structure is seen within the right ovarian
parenchyma.

A trace amount of fluid is seen adjacent to the uterus and the
adnexal regions.
IMPRESSION: No intrauterine gestational sac.  Given the history of a positive
pregnancy test, differential considerations include intrauterine
gestation too early to visualize, completed abortion, or
nonvisualized ectopic pregnancy. Given the endometrial thickening
and heterogeneity of the endometrial canal in the lower uterine
segment cervix, imaging features may well be related to completed
abortion.  A complex cystic lesion is identified in the right
ovary, but intra ovarian ectopic pregnancy in this rare.  As such,
this is felt to likely represent the corpus luteum cyst.  Close
clinical correlation is recommended with serial beta-hCG and
followup ultrasound as warranted.
